# Patient Record
Sex: Male | Born: 1980 | Hispanic: Yes | Marital: Married | Smoking: Never smoker
Health system: Southern US, Community
[De-identification: ages and names within clinical notes are randomized; demographics above are authoritative.]

## PROBLEM LIST (undated history)

## (undated) DIAGNOSIS — K219 Gastro-esophageal reflux disease without esophagitis: Secondary | ICD-10-CM

---

## 2016-09-20 HISTORY — PX: WISDOM TOOTH EXTRACTION: SHX21

## 2018-06-29 ENCOUNTER — Encounter: Payer: Self-pay | Admitting: Gastroenterology

## 2018-07-19 ENCOUNTER — Encounter: Payer: Self-pay | Admitting: Gastroenterology

## 2018-07-19 ENCOUNTER — Ambulatory Visit: Payer: BLUE CROSS/BLUE SHIELD | Admitting: Gastroenterology

## 2018-07-19 ENCOUNTER — Telehealth: Payer: Self-pay

## 2018-07-19 ENCOUNTER — Other Ambulatory Visit: Payer: Self-pay

## 2018-07-19 VITALS — BP 122/80 | HR 60 | Resp 16 | Wt 183.8 lb

## 2018-07-19 DIAGNOSIS — K219 Gastro-esophageal reflux disease without esophagitis: Secondary | ICD-10-CM

## 2018-07-19 MED ORDER — OMEPRAZOLE 40 MG PO CPDR: 40.0000 mg | DELAYED_RELEASE_CAPSULE | Freq: Two times a day (BID) | ORAL | 0 refills | Status: DC

## 2018-07-19 NOTE — Patient Instructions (Addendum)
Opciones de alimentos para pacientes con reflujo gastroesofgico - Adultos (Food Choices for Gastroesophageal Reflux Disease, Adult) Cuando se tiene reflujo gastroesofgico (ERGE), los alimentos que se ingieren y los hbitos de alimentacin son muy importantes. Elegir los alimentos adecuados puede ayudar a [employer]. QU PAUTAS DEBO SEGUIR?  Elija las frutas, los vegetales, los cereales integrales y los productos lcteos con bajo contenido de [place].  Elija las carnes de [place], de pescado y de ave con bajo contenido de grasas.  Limite las grasas, [address] [place], los aderezos para [place], la [place], los frutos secos y Programme researcher, broadcasting/film/video.  Lleve un registro de alimentos. Esto ayuda a identificar los alimentos que ocasionan sntomas.  Evite los alimentos que le ocasionen sntomas. Pueden ser distintos para cada persona.  Haga comidas pequeas durante Glass blower/designer de 3 comidas abundantes.  Coma lentamente, en un lugar donde est distendido.  Limite el consumo de alimentos fritos.  Cocine los alimentos utilizando mtodos que no sean la fritura.  Evite el consumo alcohol.  Evite beber grandes cantidades de lquidos con las comidas.  Evite agacharse o recostarse hasta despus de 2 o 3horas de haber comido.  QU ALIMENTOS NO SE RECOMIENDAN? Estos son algunos alimentos y bebidas que pueden empeorar los sntomas: Veterinary surgeon. Jugo de tomate. Salsa de tomate y espagueti. Ajes. Cebolla y [place]. Rbano picante. Frutas Naranjas, pomelos y limn (fruta y [place]). Carnes Carnes de [place], de pescado y de ave con gran contenido de grasas. Esto incluye los perros calientes, las [place], el [place], la salchicha, el salame y el tocino. Lcteos Leche entera y [place]. [employer]. Crema. [place]. Helados. Queso crema. Bebidas T o caf. Bebidas gaseosas o bebidas energizantes. Condimentos Salsa picante. Salsa barbacoa. Dulces/postres Chocolate y cacao. Ros[place]illas.  Menta y mentol. Grasas y [employer]. Esto incluye las papas fritas. Otros Vinagre. Especias picantes. Esto incluye la pimienta negra, la pimienta blanca, la pimienta roja, la pimienta de cayena, el curry en polvo, los clavos de [place], el jengibre y el [place] en polvo. Esta no es [employer] de los alimentos y las bebidas que se Theatre stage manager. Comunquese con el nutricionista para recibir ms informacin. Esta informacin no tiene Theme park manager el consejo del mdico. Asegrese de hacerle al mdico cualquier pregunta que tenga. Document Released: 03/07/2012 Document Revised: 09/27/2014 Document Reviewed: 07/11/2013 Elsevier Interactive Patient Education  2017 Elsevier Inc. Enfermedad de reflujo gastroesofgico en los adultos Gastroesophageal Reflux Disease, Adult Normalmente, la comida baja por el esfago y se mantiene en el [place], donde se la digiere. Sin embargo, cuando una persona tiene enfermedad de reflujo gastroesofgico (ERGE), la comida y jugo gstrico (cido estomacal) suben por Training and development officer. Cuando esto ocurre, el esfago se inflama y duele. Con el tiempo, la ERGE puede producir pequeos agujeros (lceras) en el revestimiento del esfago. Cules son las causas? Esta afeccin se debe a un problema en el msculo que se encuentra entre el esfago y Investment banker, corporate (esfnter esofgico inferior, o EEI). Normalmente, el EEI se cierra una vez que la comida pasa a travs del esfago hasta el [place]. Cuando el EEI se encuentra debilitado o tiene alguna anomala, no se cierra por completo, y eso permite que tanto la comida como el jugo gstrico, que es cido, [place] a subir por el esfago. El EEI puede debilitarse a causa de ciertas sustancias alimenticias, medicamentos y afecciones, que incluyen:  Consumo de Agricola.  [place].  Tener una hernia de hiato.  Consumo excesivo de  alcohol.  Ciertos alimentos y bebidas, como caf, chocolate, cebollas y [place].  Qu  incrementa el riesgo? Es ms probable que esta afeccin se manifieste en:  Personas con aumento del Runner, broadcasting/film/video.  Personas con trastornos del tejido conjuntivo.  Personas que toman antiinflamatorios no esteroideos (AINE).  Cules son los signos o los sntomas? Los sntomas de esta afeccin incluyen lo siguiente:  Merchant navy officer.  Dificultad o dolor al tragar.  Sensacin de Warehouse manager un bulto en la garganta.  Sabor amargo en la boca.  Mal aliento.  Gran cantidad de saliva.  Estmago inflamado o con Dentist.  Eructos.  Dolor en el pecho.  Dificultad para respirar o sibilancias.  Tos constante (crnica) o tos nocturna.  Desgaste del Engineer, structural.  Prdida de peso.  El dolor de pecho puede deberse a distintas enfermedades. Es importante que consulte al mdico si tiene dolor de [place]. Cmo se diagnostica? El mdico le har una historia clnica y un examen fsico. Para determinar si tiene ERGE leve o grave, el mdico tambin puede controlar cmo usted reacciona al tratamiento. Tambin pueden hacerle otros estudios, por ejemplo:  Una endoscopa para examinarle el estmago y el esfago con Neomia Dear cmara pequea.  Una prueba para medir el grado de Technical sales engineer.  Una prueba para medir cunta presin hay en el esfago.  Un estudio de trnsito baritado regular o modificado para ver la forma, el tamao y el funcionamiento del esfago.  Cmo se trata? El [place] del tratamiento es ayudar a Paramedic los sntomas y Automotive engineer las complicaciones. El tratamiento de esta afeccin puede variar segn la gravedad de los sntomas. El mdico puede recomendarle lo siguiente:  Cambios en la dieta.  Medicamentos.  Someterse a [place].  Siga estas instrucciones en su casa: Dieta  Siga la dieta recomendada por el mdico. Esto puede incluir evitar ciertos alimentos y bebidas, por ejemplo: ? Caf y t (con o sin cafena). ? Bebidas que contengan alcohol. ? Bebidas  energticas y deportivas. ? Bebidas gaseosas y refrescos. ? Chocolate y cacao. ? Menta y esencia de [place]. ? Ajo y cebolla. ? Rbano picante. ? Alimentos condimentados, picantes y cidos, por ejemplo, todos los tipos de pimientas, [place] en polvo, curry en polvo, vinagre, salsas picantes y [employer]. ? Ctricos y sus jugos, por ejemplo, naranjas, limones y limas. ? Alimentos a base de [address], como salsa de [place], [place], salsa picante y pizza con salsa de [place]. ? Alimentos fritos y [place], [place] donas, papas fritas y aderezos ricos en grasas. ? Carnes con alto contenido de grasa, como salchichas, y cortes de carnes rojas y blancas con mucha grasa, por ejemplo, chuletas o costillas, embutidos, jamn y tocino. ? Productos lcteos ricos en grasas, como leche [place], [place] y [place] crema.  Haga varias comidas pequeas y frecuentes Freight forwarder de comidas abundantes.  Evite beber grandes cantidades de lquidos con las comidas.  Evite comer 2 o 3horas antes de acostarse.  Evite recostarse inmediatamente despus de comer.  No haga ejercicios enseguida despus de comer. Instrucciones generales  Est atento a cualquier cambio en los sntomas.  [employer] medicamentos de venta libre y los recetados solamente como se lo haya indicado el mdico. No tome aspirina, ibuprofeno ni otros antiinflamatorios no esteroideos (AINE) a menos que el mdico se lo indique.  No consuma ningn producto que contenga tabaco, lo que incluye cigarrillos, tabaco de Theatre manager y Administrator, Civil Service. Si necesita ayuda para dejar de fumar, consulte al mdico.  Use ropas sueltas. No use nada apretado alrededor de la cintura que haga presin sobre el abdomen.  Levante (eleve) la cabecera de la cama 6pulgadas (15cm).  Trate de reducir [employer] de estrs con actividades tales como el yoga o la meditacin. Si necesita ayuda para reducir [employer] de estrs, consulte al mdico.  Si tiene sobrepeso, baje  hasta llegar a un peso saludable para usted. Pdale consejos al mdico para bajar de peso de [place] segura.  Concurra a todas las visitas de control como se lo haya indicado el mdico. Esto es importante. Comunquese con un mdico si:  Aparecen nuevos sntomas.  Baja de peso sin causa aparente.  Tiene dificultad para tragar, o le duele cuando traga.  Tiene tos persistente o sibilancias.  Los sntomas no mejoran con Scientist, research (medical).  Tiene la voz ronca. Solicite ayuda de inmediato si:  [employer], el cuello, la [place], los dientes o la espalda.  Se siente transpirado, mareado o tiene una sensacin de desvanecimiento.  Siente falta de aire o Journalist, newspaper.  Vomita y el vmito tiene un aspecto similar a la sangre o a los granos de caf.  Se desmaya.  Las heces son sanguinolentas o negras.  No puede tragar, beber o comer. Esta informacin no tiene Theme park manager el consejo del mdico. Asegrese de hacerle al mdico cualquier pregunta que tenga. Document Released: 06/16/2005 Document Revised: 12/10/2016 Document Reviewed: 01/01/2015 Elsevier Interactive Patient Education  2018 [employer].  Enfermedad de reflujo gastroesofgico en los adultos Gastroesophageal Reflux Disease, Adult Normalmente, la comida baja por el esfago y se mantiene en el [place], donde se la digiere. Sin embargo, cuando una persona tiene enfermedad de reflujo gastroesofgico (ERGE), la comida y jugo gstrico (cido estomacal) suben por Training and development officer. Cuando esto ocurre, el esfago se inflama y duele. Con el tiempo, la ERGE puede producir pequeos agujeros (lceras) en el revestimiento del esfago. Cules son las causas? Esta afeccin se debe a un problema en el msculo que se encuentra entre el esfago y Investment banker, corporate (esfnter esofgico inferior, o EEI). Normalmente, el EEI se cierra una vez que la comida pasa a travs del esfago hasta el [place]. Cuando el EEI se encuentra  debilitado o tiene alguna anomala, no se cierra por completo, y eso permite que tanto la comida como el jugo gstrico, que es cido, [place] a subir por el esfago. El EEI puede debilitarse a causa de ciertas sustancias alimenticias, medicamentos y afecciones, que incluyen:  Consumo de [place].  [place].  Tener una hernia de hiato.  Consumo excesivo de alcohol.  Ciertos alimentos y bebidas, como caf, chocolate, cebollas y [place].  Qu incrementa el riesgo? Es ms probable que esta afeccin se manifieste en:  Personas con aumento del Runner, broadcasting/film/video.  Personas con trastornos del tejido conjuntivo.  Personas que toman antiinflamatorios no esteroideos (AINE).  Cules son los signos o los sntomas? Los sntomas de esta afeccin incluyen lo siguiente:  Merchant navy officer.  Dificultad o dolor al tragar.  Sensacin de Warehouse manager un bulto en la garganta.  Sabor amargo en la boca.  Mal aliento.  Gran cantidad de saliva.  Estmago inflamado o con Dentist.  Eructos.  Dolor en el pecho.  Dificultad para respirar o sibilancias.  Tos constante (crnica) o tos nocturna.  Desgaste del Engineer, structural.  Prdida de peso.  El dolor de pecho puede deberse a distintas enfermedades. Es importante que consulte al mdico si tiene dolor de [place]. Cmo se diagnostica?  El mdico le har una historia clnica y un examen fsico. Para determinar si tiene ERGE leve o grave, el mdico tambin puede controlar cmo usted reacciona al tratamiento. Tambin pueden hacerle otros estudios, por ejemplo:  Una endoscopa para examinarle el estmago y el esfago con Neomia Dear cmara pequea.  Una prueba para medir el grado de Technical sales engineer.  Una prueba para medir cunta presin hay en el esfago.  Un estudio de trnsito baritado regular o modificado para ver la forma, el tamao y el funcionamiento del esfago.  Cmo se trata? [place] del tratamiento es ayudar a Paramedic los sntomas y Automotive engineer las  complicaciones. El tratamiento de esta afeccin puede variar segn la gravedad de los sntomas. El mdico puede recomendarle lo siguiente:  Cambios en la dieta.  Medicamentos.  Someterse a [place].  Siga estas instrucciones en su casa: Dieta  Siga la dieta recomendada por el mdico. Esto puede incluir evitar ciertos alimentos y bebidas, por ejemplo: ? Caf y t (con o sin cafena). ? Bebidas que contengan alcohol. ? Bebidas energticas y deportivas. ? Bebidas gaseosas y refrescos. ? Chocolate y cacao. ? Menta y esencia de [place]. ? Ajo y cebolla. ? Rbano picante. ? Alimentos condimentados, picantes y cidos, por ejemplo, todos los tipos de pimientas, [place] en polvo, curry en polvo, vinagre, salsas picantes y [employer]roleum. ? Ctricos y sus jugos, por ejemplo, naranjas, limones y limas. ? Alimentos a base de [address], como salsa de [place], [place], salsa picante y pizza con salsa de [place]. ? Alimentos fritos y [place], [place] donas, papas fritas y aderezos ricos en grasas. ? Carnes con alto contenido de grasa, como salchichas, y cortes de carnes rojas y blancas con mucha grasa, por ejemplo, chuletas o costillas, embutidos, jamn y tocino. ? Productos lcteos ricos en grasas, como leche [place], [place] y [place] crema.  Haga varias comidas pequeas y frecuentes Freight forwarder de comidas abundantes.  Evite beber grandes cantidades de lquidos con las comidas.  Evite comer 2 o 3horas antes de acostarse.  Evite recostarse inmediatamente despus de comer.  No haga ejercicios enseguida despus de comer. Instrucciones generales  Est atento a cualquier cambio en los sntomas.  [employer] medicamentos de venta libre y los recetados solamente como se lo haya indicado el mdico. No tome aspirina, ibuprofeno ni otros antiinflamatorios no esteroideos (AINE) a menos que el mdico se lo indique.  No consuma ningn producto que contenga tabaco, lo que incluye cigarrillos, tabaco de  Theatre manager y Administrator, Civil Service. Si necesita ayuda para dejar de fumar, consulte al mdico.  Use ropas sueltas. No use nada apretado alrededor de la cintura que haga presin sobre el abdomen.  Levante (eleve) la cabecera de la cama 6pulgadas (15cm).  Trate de reducir [employer] de estrs con actividades tales como el yoga o la meditacin. Si necesita ayuda para reducir [employer] de estrs, consulte al mdico.  Si tiene sobrepeso, baje hasta llegar a un peso saludable para usted. Pdale consejos al mdico para bajar de peso de [place] segura.  Concurra a todas las visitas de control como se lo haya indicado el mdico. Esto es importante. Comunquese con un mdico si:  Aparecen nuevos sntomas.  Baja de peso sin causa aparente.  Tiene dificultad para tragar, o le duele cuando traga.  Tiene tos persistente o sibilancias.  Los sntomas no mejoran con Scientist, research (medical).  Tiene la voz ronca. Solicite ayuda de inmediato si:  [employer], [place]  cuello, la mandbula, los dientes o la espalda.  Se siente transpirado, mareado o tiene una sensacin de desvanecimiento.  Siente falta de aire o Journalist, newspaper.  Vomita y el vmito tiene un aspecto similar a la sangre o a los granos de caf.  Se desmaya.  Las heces son sanguinolentas o negras.  No puede tragar, beber o comer. Esta informacin no tiene Theme park manager el consejo del mdico. Asegrese de hacerle al mdico cualquier pregunta que tenga. Document Released: 06/16/2005 Document Revised: 12/10/2016 Document Reviewed: 01/01/2015 Elsevier Interactive Patient Education  [employer].

## 2018-07-19 NOTE — Progress Notes (Signed)
   Arlyss Repress, MD [address]  [address]  [place], [place] [address]  Main: [phone]  Fax: [phone]    Gastroenterology Consultation  Referring Provider:     Guadlupe Spanish, [place]* Primary Care Physician:  Guadlupe Spanish, PA-C Primary Gastroenterologist:  Dr. Arlyss Repress Reason for Consultation:  Heartburn        HPI:   Taylor Mullen is a 37 y.o. Spanish-speaking male referred by Dr. Nelson Chimes, [place] Gray, PA-C  for consultation & management of heartburn.  Patient reports that he has been experiencing several years history of severe burning sensation in the chest associated with regurgitation.  He denies difficulty swallowing, epigastric pain, nausea, weight loss.  He was started on omeprazole 20 mg once daily which did not provide any relief.  He tried twice daily temporarily.  He denies smoking or drinking alcohol.  He denies any recent weight gain. He had laboratory tests performed by his PCP about a month ago and reportedly normal  He denies family history of GI malignancy  NSAIDs: None  Antiplts/Anticoagulants/Anti thrombotics: None  GI Procedures: None He denies any GI surgeries  No past medical history on file.   Prior to Admission medications   Not on File    No family history on file.   Social History   Tobacco Use  . Smoking status: Never Smoker  . Smokeless tobacco: Never Used  Substance Use Topics  . Alcohol use: Never    Frequency: Never  . Drug use: Never    Allergies as of 07/19/2018  . (No Known Allergies)    Review of Systems:    All systems reviewed and negative except where noted in HPI.   Physical Exam:  BP 122/80 (BP Location: Left Arm, Patient Position: Sitting, Cuff Size: Large)   Pulse 60   Resp 16   Wt 183 lb 12.8 oz (83.4 kg)  No LMP for male patient.  General:   Alert,  Well-developed, well-nourished, pleasant and cooperative in NAD Head:  Normocephalic and atraumatic. Eyes:  Sclera clear,  no icterus.   Conjunctiva pink. Ears:  Normal auditory acuity. Nose:  No deformity, discharge, or lesions. Mouth:  No deformity or lesions,oropharynx pink & moist. Neck:  Supple; no masses or thyromegaly. Lungs:  Respirations even and unlabored.  Clear throughout to auscultation.   No wheezes, crackles, or rhonchi. No acute distress. Heart:  Regular rate and rhythm; no murmurs, clicks, rubs, or gallops. Abdomen:  Normal bowel sounds. Soft, non-tender and non-distended without masses, hepatosplenomegaly or hernias noted.  No guarding or rebound tenderness.   Rectal: Not performed Msk:  Symmetrical without gross deformities. Good, equal movement & strength bilaterally. Pulses:  Normal pulses noted. Extremities:  No clubbing or edema.  No cyanosis. Neurologic:  Alert and oriented x3;  grossly normal neurologically. Skin:  Intact without significant lesions or rashes. No jaundice. Lymph Nodes:  No significant cervical adenopathy. Psych:  Alert and cooperative. Normal mood and affect.  Imaging Studies: None  Assessment and Plan:   Taylor Mullen is a 37 y.o. Spanish-speaking male with chronic heartburn with regurgitation.  GERD: Start Prilosec 40 mg twice daily Antireflux lifestyle, information provided in Spanish Schedule EGD   Follow up in 2 months   Arlyss Repress, MD

## 2018-07-20 NOTE — Telephone Encounter (Signed)
Medication has been sent to [facility], pt has been notified

## 2018-07-31 ENCOUNTER — Encounter: Payer: Self-pay | Admitting: Emergency Medicine

## 2018-08-01 ENCOUNTER — Ambulatory Visit
Admission: RE | Admit: 2018-08-01 | Discharge: 2018-08-01 | Disposition: A | Discharge status: Home / Self Care | Payer: BLUE CROSS/BLUE SHIELD | Source: Ambulatory Visit | Attending: Gastroenterology | Admitting: Gastroenterology

## 2018-08-01 ENCOUNTER — Other Ambulatory Visit: Payer: Self-pay

## 2018-08-01 ENCOUNTER — Ambulatory Visit: Payer: BLUE CROSS/BLUE SHIELD | Admitting: Anesthesiology

## 2018-08-01 ENCOUNTER — Encounter
Admission: RE | Disposition: A | Discharge status: Home / Self Care | Payer: Self-pay | Source: Ambulatory Visit | Attending: Gastroenterology

## 2018-08-01 DIAGNOSIS — Z6832 Body mass index (BMI) 32.0-32.9, adult: Secondary | ICD-10-CM | POA: Insufficient documentation

## 2018-08-01 DIAGNOSIS — Z79899 Other long term (current) drug therapy: Secondary | ICD-10-CM | POA: Diagnosis not present

## 2018-08-01 DIAGNOSIS — K2950 Unspecified chronic gastritis without bleeding: Secondary | ICD-10-CM | POA: Insufficient documentation

## 2018-08-01 DIAGNOSIS — K219 Gastro-esophageal reflux disease without esophagitis: Secondary | ICD-10-CM | POA: Diagnosis not present

## 2018-08-01 DIAGNOSIS — R12 Heartburn: Secondary | ICD-10-CM | POA: Diagnosis not present

## 2018-08-01 DIAGNOSIS — K228 Other specified diseases of esophagus: Secondary | ICD-10-CM | POA: Diagnosis not present

## 2018-08-01 DIAGNOSIS — E669 Obesity, unspecified: Secondary | ICD-10-CM | POA: Diagnosis not present

## 2018-08-01 DIAGNOSIS — K3189 Other diseases of stomach and duodenum: Secondary | ICD-10-CM | POA: Insufficient documentation

## 2018-08-01 HISTORY — PX: ESOPHAGOGASTRODUODENOSCOPY (EGD) WITH PROPOFOL: SHX5813

## 2018-08-01 HISTORY — DX: Gastro-esophageal reflux disease without esophagitis: K21.9

## 2018-08-01 SURGERY — ESOPHAGOGASTRODUODENOSCOPY (EGD) WITH PROPOFOL
Anesthesia: General

## 2018-08-01 MED ORDER — PROPOFOL 10 MG/ML IV BOLUS
INTRAVENOUS | Status: AC
  Filled 2018-08-01: qty 40

## 2018-08-01 MED ORDER — LIDOCAINE HCL (CARDIAC) PF 100 MG/5ML IV SOSY
PREFILLED_SYRINGE | INTRAVENOUS | Status: DC | PRN
  Administered 2018-08-01: 80 mg via INTRAVENOUS

## 2018-08-01 MED ORDER — PROPOFOL 10 MG/ML IV BOLUS
INTRAVENOUS | Status: DC | PRN
  Administered 2018-08-01: 100 mg via INTRAVENOUS

## 2018-08-01 MED ORDER — FENTANYL CITRATE (PF) 100 MCG/2ML IJ SOLN
INTRAMUSCULAR | Status: DC | PRN
  Administered 2018-08-01: 25 ug via INTRAVENOUS
  Administered 2018-08-01: 50 ug via INTRAVENOUS
  Administered 2018-08-01: 25 ug via INTRAVENOUS

## 2018-08-01 MED ORDER — FENTANYL CITRATE (PF) 100 MCG/2ML IJ SOLN
INTRAMUSCULAR | Status: AC
  Filled 2018-08-01: qty 2

## 2018-08-01 MED ORDER — SODIUM CHLORIDE 0.9 % IV SOLN
INTRAVENOUS | Status: DC
  Administered 2018-08-01: 1000 mL via INTRAVENOUS

## 2018-08-01 NOTE — OR Nursing (Signed)
Maryjane HurterOtto here to interpret spanish for patient and wife.

## 2018-08-01 NOTE — Op Note (Signed)
[facility] Gastroenterology Patient Name: Taylor Mullen Procedure Date: 08/01/2018 1:52 PM MRN: [id number] Account #: [id number] Date of Birth: 1981/07/19 Admit Type: Outpatient Age: 37 Room: [facility] ENDO ROOM 4 Gender: Male Note Status: Finalized Procedure:            Upper GI endoscopy Indications:          Heartburn Providers:            Lin Landsman MD, MD Referring MD:         [facility] Medicines:            Monitored Anesthesia Care Complications:        No immediate complications. Estimated blood loss: None. Procedure:            Pre-Anesthesia Assessment:                       - Prior to the procedure, a History and Physical was                        performed, and patient medications and allergies were                        reviewed. The patient is competent. The risks and                        benefits of the procedure and the sedation options and                        risks were discussed with the patient. All questions                        were answered and informed consent was obtained.                        Patient identification and proposed procedure were                        verified by the physician, the nurse, the                        anesthesiologist, the anesthetist and the technician in                        the pre-procedure area in the procedure room in the                        endoscopy suite. Mental Status Examination: alert and                        oriented. Airway Examination: normal oropharyngeal                        airway and neck mobility. Respiratory Examination:                        clear to auscultation. CV Examination: normal.                        Prophylactic Antibiotics: The patient does not require  prophylactic antibiotics. Prior Anticoagulants: The                        patient has taken no previous anticoagulant or                        antiplatelet agents. ASA  Grade Assessment: II - A                        patient with mild systemic disease. After reviewing the                        risks and benefits, the patient was deemed in                        satisfactory condition to undergo the procedure. The                        anesthesia plan was to use monitored anesthesia care                        (MAC). Immediately prior to administration of                        medications, the patient was re-assessed for adequacy                        to receive sedatives. The heart rate, respiratory rate,                        oxygen saturations, blood pressure, adequacy of                        pulmonary ventilation, and response to care were                        monitored throughout the procedure. The physical status                        of the patient was re-assessed after the procedure.                       After obtaining informed consent, the endoscope was                        passed under direct vision. Throughout the procedure,                        the patient's blood pressure, pulse, and oxygen                        saturations were monitored continuously. The Endoscope                        was introduced through the mouth, and advanced to the                        second part of duodenum. The upper GI endoscopy was  accomplished without difficulty. The patient tolerated                        the procedure well. Findings:      The duodenal bulb and second portion of the duodenum were normal.      A single umbilicated lesion measuring 6 mm in diameter was found on the       greater curvature of the gastric antrum.      Diffuse mildly erythematous mucosa without bleeding was found in the       prepyloric region of the stomach.      The exam of the stomach was otherwise normal.      The cardia and gastric fundus were normal on retroflexion.      The gastric body was normal. Biopsies were taken with a cold  forceps for       Helicobacter pylori testing.      The Z-line was irregular and was found 35 cm from the incisors.      The examined esophagus was normal. Biopsies were taken with a cold       forceps for histology. Impression:           - Normal duodenal bulb and second portion of the                        duodenum.                       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                       - Erythematous mucosa in the prepyloric region of the                        stomach.                       - Normal gastric body. Biopsied.                       - Z-line irregular, 35 cm from the incisors.                       - Normal esophagus. Biopsied. Recommendation:       - Await pathology results.                       - Discharge patient to home (with spouse).                       - Resume previous diet today.                       - Continue present medications.                       - Await pathology results.                       - Return to my office as previously scheduled.                       -  Follow an antireflux regimen.                       - Use Prilosec (omeprazole) 40 mg PO daily. Procedure Code(s):    --- Professional ---                       [id number], Esophagogastroduodenoscopy, flexible, transoral;                        with biopsy, single or multiple Diagnosis Code(s):    --- Professional ---                       K31.89, Other diseases of stomach and duodenum                       K22.8, Other specified diseases of esophagus                       R12, Heartburn CPT copyright 2018 American [employer] Association. All rights reserved. The codes documented in this report are preliminary and upon coder review may  be revised to meet current compliance requirements. Dr. Ulyess Mort Lin Landsman MD, MD 08/01/2018 2:14:55 PM This report has been signed electronically. Number of Addenda: 0 Note  Initiated On: 08/01/2018 1:52 PM      [facility]

## 2018-08-01 NOTE — Anesthesia Preprocedure Evaluation (Signed)
Anesthesia Evaluation  Patient identified by MRN, date of birth, ID band Patient awake    Reviewed: Allergy & Precautions, NPO status , Patient's Chart, lab work & pertinent test results  History of Anesthesia Complications Negative for: history of anesthetic complications  Airway Mallampati: II  TM Distance: >3 FB Neck ROM: Full    Dental no notable dental hx.    Pulmonary neg pulmonary ROS, neg sleep apnea, neg COPD,    breath sounds clear to auscultation- rhonchi (-) wheezing      Cardiovascular Exercise Tolerance: Good (-) hypertension(-) CAD, (-) Past MI, (-) Cardiac Stents and (-) CABG  Rhythm:Regular Rate:Normal - Systolic murmurs and - Diastolic murmurs    Neuro/Psych negative neurological ROS  negative psych ROS   GI/Hepatic Neg liver ROS, GERD  ,  Endo/Other  negative endocrine ROSneg diabetes  Renal/GU negative Renal ROS     Musculoskeletal negative musculoskeletal ROS (+)   Abdominal (+) + obese,   Peds  Hematology negative hematology ROS (+)   Anesthesia Other Findings Past Medical History: No date: GERD (gastroesophageal reflux disease)   Reproductive/Obstetrics                             Anesthesia Physical Anesthesia Plan  ASA: II  Anesthesia Plan: General   Post-op Pain Management:    Induction: Intravenous  PONV Risk Score and Plan: 1 and Propofol infusion  Airway Management Planned: Natural Airway  Additional Equipment:   Intra-op Plan:   Post-operative Plan:   Informed Consent: I have reviewed the patients History and Physical, chart, labs and discussed the procedure including the risks, benefits and alternatives for the proposed anesthesia with the patient or authorized representative who has indicated his/her understanding and acceptance.   Dental advisory given  Plan Discussed with: CRNA and Anesthesiologist  Anesthesia Plan Comments:          Anesthesia Quick Evaluation

## 2018-08-01 NOTE — Anesthesia Post-op Follow-up Note (Signed)
Anesthesia QCDR form completed.        

## 2018-08-01 NOTE — H&P (Signed)
Arlyss Repress, MD [address]  [address]  [place], [place] [address]  Main: [phone]  Fax: [phone] Pager: [phone]  Primary Care Physician:  Guadlupe Spanish, PA-C Primary Gastroenterologist:  Dr. Arlyss Repress  Pre-Procedure History & Physical: HPI:  Taylor Mullen is a 37 y.o. male is here for an endoscopy.   Past Medical History:  Diagnosis Date  . GERD (gastroesophageal reflux disease)     Past Surgical History:  Procedure Laterality Date  . WISDOM TOOTH EXTRACTION  2018    Prior to Admission medications   Medication Sig Start Date End Date Taking? Authorizing Provider  omeprazole (PRILOSEC) 40 MG capsule Take 1 capsule (40 mg total) by mouth 2 (two) times daily before a meal. 07/19/18 10/17/18 Yes Maymunah Stegemann, Loel Dubonnet, MD    Allergies as of 07/20/2018  . (No Known Allergies)    History reviewed. No pertinent family history.  Social History   Socioeconomic History  . Marital status: Married    Spouse name: Not on file  . Number of children: Not on file  . Years of education: Not on file  . Highest education level: Not on file  Occupational History  . Not on file  Social Needs  . Financial resource strain: Not on file  . Food insecurity:    Worry: Not on file    Inability: Not on file  . Transportation needs:    Medical: Not on file    Non-medical: Not on file  Tobacco Use  . Smoking status: Never Smoker  . Smokeless tobacco: Never Used  Substance and Sexual Activity  . Alcohol use: Never    Frequency: Never  . Drug use: Never  . Sexual activity: Yes    Partners: Female  Lifestyle  . Physical activity:    Days per week: Not on file    Minutes per session: Not on file  . Stress: Not on file  Relationships  . Social connections:    Talks on phone: Not on file    Gets together: Not on file    Attends religious service: Not on file    Active member of club or organization: Not on file    Attends meetings of clubs or  organizations: Not on file    Relationship status: Not on file  . Intimate partner violence:    Fear of current or ex partner: Not on file    Emotionally abused: Not on file    Physically abused: Not on file    Forced sexual activity: Not on file  Other Topics Concern  . Not on file  Social History Narrative  . Not on file    Review of Systems: See HPI, otherwise negative ROS  Physical Exam: BP 119/77   Pulse (!) 55   Temp (!) 97.3 F (36.3 C) (Tympanic)   Resp 16   Ht 5\' 2"  (1.575 m)   Wt 81.6 kg   SpO2 97%   BMI 32.92 kg/m  General:   Alert,  pleasant and cooperative in NAD Head:  Normocephalic and atraumatic. Neck:  Supple; no masses or thyromegaly. Lungs:  Clear throughout to auscultation.    Heart:  Regular rate and rhythm. Abdomen:  Soft, nontender and nondistended. Normal bowel sounds, without guarding, and without rebound.   Neurologic:  Alert and  oriented x4;  grossly normal neurologically.  Impression/Plan: Taylor Mullen is here for an endoscopy to be performed for GERD  Risks, benefits, limitations, and alternatives regarding  endoscopy have been reviewed with the patient.  Questions have been answered.  All parties agreeable.   Lannette Donath, MD  08/01/2018, 1:48 PM

## 2018-08-01 NOTE — Anesthesia Postprocedure Evaluation (Signed)
Anesthesia Post Note  Patient: Taylor Mullen  Procedure(s) Performed: ESOPHAGOGASTRODUODENOSCOPY (EGD) WITH PROPOFOL (N/A )  Patient location during evaluation: Endoscopy Anesthesia Type: General Level of consciousness: awake and alert Pain management: pain level controlled Vital Signs Assessment: post-procedure vital signs reviewed and stable Respiratory status: spontaneous breathing, nonlabored ventilation, respiratory function stable and patient connected to nasal cannula oxygen Cardiovascular status: blood pressure returned to baseline and stable Postop Assessment: no apparent nausea or vomiting Anesthetic complications: no     Last Vitals:  Vitals:   08/01/18 1450 08/01/18 1500  BP: 111/78 111/78  Pulse: (!) 52 (!) 50  Resp: 15 14  Temp:    SpO2: 100% 98%    Last Pain:  Vitals:   08/01/18 1304  TempSrc: Tympanic  PainSc: 0-No pain                 Niomie Englert S

## 2018-08-01 NOTE — Transfer of Care (Signed)
Immediate Anesthesia Transfer of Care Note  Patient: Taylor Mullen  Procedure(s) Performed: ESOPHAGOGASTRODUODENOSCOPY (EGD) WITH PROPOFOL (N/A )  Patient Location: PACU  Anesthesia Type:General  Level of Consciousness: awake, alert  and oriented  Airway & Oxygen Therapy: Patient Spontanous Breathing and Patient connected to nasal cannula oxygen  Post-op Assessment: Report given to RN and Post -op Vital signs reviewed and stable  Post vital signs: Reviewed and stable  Last Vitals:  Vitals Value Taken Time  BP    Temp    Pulse    Resp    SpO2      Last Pain:  Vitals:   08/01/18 1304  TempSrc: Tympanic  PainSc: 0-No pain         Complications: No apparent anesthesia complications

## 2018-08-02 ENCOUNTER — Encounter: Payer: Self-pay | Admitting: Gastroenterology

## 2018-08-04 LAB — SURGICAL PATHOLOGY

## 2018-08-29 ENCOUNTER — Ambulatory Visit: Payer: BLUE CROSS/BLUE SHIELD | Admitting: Gastroenterology

## 2018-10-03 ENCOUNTER — Encounter: Payer: Self-pay | Admitting: Gastroenterology

## 2018-10-03 ENCOUNTER — Ambulatory Visit: Payer: BLUE CROSS/BLUE SHIELD | Admitting: Gastroenterology

## 2018-10-03 VITALS — BP 117/73 | HR 64 | Resp 17 | Wt 182.6 lb

## 2018-10-03 DIAGNOSIS — K219 Gastro-esophageal reflux disease without esophagitis: Secondary | ICD-10-CM | POA: Diagnosis not present

## 2018-10-03 DIAGNOSIS — E6609 Other obesity due to excess calories: Secondary | ICD-10-CM

## 2018-10-03 DIAGNOSIS — Z6833 Body mass index (BMI) 33.0-33.9, adult: Secondary | ICD-10-CM

## 2018-10-03 DIAGNOSIS — E669 Obesity, unspecified: Secondary | ICD-10-CM | POA: Insufficient documentation

## 2018-10-03 NOTE — Progress Notes (Signed)
Arlyss Repress, MD [address]  [address]  [place], [place] [address]  Main: [phone]  Fax: [phone]    Gastroenterology Consultation  Referring Provider:     Marya Fossa, PA-C Primary Care Physician:  Marya Fossa, PA-C Primary Gastroenterologist:  Dr. Arlyss Repress Reason for Consultation:  Heartburn        HPI:   Taylor Mullen is a 38 y.o. Spanish-speaking male referred by Dr. Marya Fossa, PA-C  for consultation & management of heartburn.  Patient reports that he has been experiencing several years history of severe burning sensation in the chest associated with regurgitation.  He denies difficulty swallowing, epigastric pain, nausea, weight loss.  He was started on omeprazole 20 mg once daily which did not provide any relief.  He tried twice daily temporarily.  He denies smoking or drinking alcohol.  He denies any recent weight gain. He had laboratory tests performed by his PCP about a month ago and reportedly normal  Follow-up visit 10/03/2018 Patient underwent EGD which was unremarkable including normal gastric biopsies and no evidence of eosinophilic esophagitis.  Patient is taking omeprazole 40 mg twice daily before meals.  He continues to have regurgitation although not heartburn.  His weight has been stable.  He denies family history of GI malignancy  NSAIDs: None  Antiplts/Anticoagulants/Anti thrombotics: None  GI Procedures:  EGD 08/01/2018 - Normal duodenal bulb and second portion of the duodenum. 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 - Erythematous mucosa in the prepyloric region of the stomach. - Normal gastric body. Biopsied. - Z-line irregular, 35 cm from the incisors. - Normal esophagus. Biopsied.  DIAGNOSIS:  A. 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  - NEGATIVE FOR H. PYLORI, DYSPLASIA, AND MALIGNANCY.   B.  ESOPHAGUS, DISTAL; COLD BIOPSY:  - UNREMARKABLE SQUAMOUS MUCOSA.  - NEGATIVE FOR EOSINOPHILS, DYSPLASIA, AND MALIGNANCY.   He denies any GI surgeries  Past Medical History:  Diagnosis Date  . GERD (gastroesophageal reflux disease)     Current Outpatient Medications:  .  omeprazole (PRILOSEC) 40 MG capsule, Take 1 capsule (40 mg total) by mouth 2 (two) times daily before a meal., Disp: 180 capsule, Rfl: 0  No family history on file.   Social History   Tobacco Use  . Smoking status: Never Smoker  . Smokeless tobacco: Never Used  Substance Use Topics  . Alcohol use: Never    Frequency: Never  . Drug use: Never    Allergies as of 10/03/2018  . (No Known Allergies)    Review of Systems:    All systems reviewed and negative except where noted in HPI.   Physical Exam:  BP 117/73 (BP Location: Left Arm, Patient Position: Sitting, Cuff Size: Large)   Pulse 64   Resp 17   Wt 182 lb 9.6 oz (82.8 kg)   BMI 33.40 kg/m  No LMP for male patient.  General:   Alert,  Well-developed, well-nourished, pleasant and cooperative in NAD Head:  Normocephalic and atraumatic. Eyes:  Sclera clear, no icterus.   Conjunctiva pink. Ears:  Normal auditory acuity. Nose:  No deformity, discharge, or lesions. Mouth:  No deformity or lesions,oropharynx pink & moist. Neck:  Supple; no masses or thyromegaly. Lungs:  Respirations even and unlabored.  Clear throughout to auscultation.   No wheezes, crackles, or rhonchi. No acute distress. Heart:  Regular rate and rhythm; no  murmurs, clicks, rubs, or gallops. Abdomen:  Normal bowel sounds. Soft, non-tender and non-distended without masses, hepatosplenomegaly or hernias noted.  No guarding or rebound tenderness.   Rectal: Not performed Msk:  Symmetrical without gross deformities. Good, equal movement & strength bilaterally. Pulses:  Normal pulses noted. Extremities:  No clubbing or edema.  No cyanosis. Neurologic:  Alert and oriented x3;  grossly  normal neurologically. Skin:  Intact without significant lesions or rashes. No jaundice. Lymph Nodes:  No significant cervical adenopathy. Psych:  Alert and cooperative. Normal mood and affect.  Imaging Studies: None  Assessment and Plan:   Taylor Mullen is a 38 y.o. Spanish-speaking male with chronic heartburn and regurgitation, symptoms partially improved on omeprazole 40 mg twice daily.  He continues to have regurgitation  GERD: EGD with no evidence of erosive esophagitis or eosinophilic esophagitis Switch from Prilosec to Dexilant 60 mg daily, samples provided Antireflux lifestyle, information provided in Spanish Highly encouraged him to lose 10 to 15 pounds If his symptoms are persistent, will evaluate for gallbladder etiology   Follow up in 3 months   Arlyss Repress, MD

## 2018-10-03 NOTE — Patient Instructions (Signed)
Enfermedad de reflujo gastroesofgico en los adultos Gastroesophageal Reflux Disease, Adult El reflujo gastroesofgico (RGE) ocurre cuando el cido del estmago sube por el tubo que conecta la boca con el estmago (esfago). Normalmente, la comida baja por el esfago y se mantiene en el estmago, donde se la digiere. Sin embargo, cuando una persona tiene ERGE, los alimentos y el cido estomacal suelen volver al esfago. Si esto se vuelve un problema ms grave, a la persona se le puede diagnosticar una enfermedad llamada enfermedad de reflujo gastroesofgico (ERGE). La ERGE ocurre cuando el reflujo:  Sucede a menudo.  Causa sntomas frecuentes o graves.  Causa problemas tales como dao en el esfago. Cuando el cido del estmago entra en contacto con el esfago, el cido puede provocar dolor (inflamacin) en el esfago. Con el tiempo, pueden formarse pequeos agujeros (lceras) en el revestimiento del esfago. Cules son las causas? Esta afeccin se debe a un problema en el msculo que se encuentra entre el esfago y el estmago (esfnter esofgico inferior, o EEI). Normalmente, el EEI se cierra una vez que la comida pasa a travs del esfago hasta el estmago. Cuando el EEI se encuentra debilitado o tiene alguna anomala, no se cierra por completo, y eso permite que tanto la comida como el jugo gstrico, que es cido, vuelvan a subir por el esfago. El EEI puede debilitarse a causa de ciertas sustancias alimenticias, medicamentos y afecciones, que incluyen:  El consumo de tabaco.  Embarazo.  Tener una hernia de hiato.  Consumo de alcohol.  Ciertos alimentos y bebidas, como caf, chocolate, cebollas y menta. Qu incrementa el riesgo? Es ms probable que tenga esta afeccin si:  Tiene un aumento del peso corporal.  Tiene un trastorno del tejido conjuntivo.  Usa antiinflamatorios no esteroideos (AINE). Cules son los signos o los sntomas? Los sntomas de esta afeccin  incluyen:  Acidez estomacal.  Dificultad o dolor al tragar.  Sensacin de tener un bulto en la garganta.  Sabor amargo en la boca.  Mal aliento.  Gran cantidad de saliva.  Estmago inflamado o con malestar.  Eructos.  Dolor en el pecho. El dolor de pecho puede deberse a distintas afecciones. Es importante que consulte al mdico si tiene dolor de pecho.  Dificultad para respirar o sibilancias.  Tos constante (crnica) o tos nocturna.  Desgaste del esmalte dental.  Prdida de peso. Cmo se diagnostica? El mdico le har una historia clnica y un examen fsico. Para determinar si tiene ERGE leve o grave, el mdico tambin puede controlar cmo usted reacciona al tratamiento. Tambin pueden hacerle estudios, que incluyen los siguientes:  Un estudio para examinarle el estmago y el esfago con una cmara pequea (endoscopa).  Una prueba para medir el grado de acidez en el esfago.  Una prueba para medir cunta presin hay en el esfago.  Un estudio de deglucin con bario comn o modificado para ver la forma, el tamao y el funcionamiento del esfago. Cmo se trata? El objetivo del tratamiento es ayudar a aliviar los sntomas y evitar las complicaciones. El tratamiento de esta afeccin puede variar segn la gravedad de los sntomas. El mdico puede recomendarle lo siguiente:  Cambios en la dieta.  Medicamentos.  Una ciruga. Siga estas indicaciones en su casa: Comida y bebida   Siga la dieta recomendada por el mdico. Esto puede incluir evitar ciertos alimentos y bebidas, por ejemplo: ? Caf y t (con o sin cafena). ? Bebidas que contengan alcohol. ? Bebidas energticas y deportivas. ? Bebidas gaseosas o   refrescos. ? Chocolate y cacao. ? Menta y [place]. ? Ajo y cebolla. ? Rbano picante. ? Alimentos condimentados, picantes y cidos, por ejemplo, todos los tipos de pimientas, [place] en polvo, curry en polvo, vinagre, salsas picantes y [employer]nic[employer]. ? Ctricos y sus jugos, por ejemplo, naranjas, limones y limas. ? Alimentos a base de tomate, como salsa de [place], [place], salsa picante y pizza con salsa de [place]. ? Alimentos fritos y [place], [place] donas, papas fritas y aderezos ricos en grasas. ? Carnes con alto contenido de grasa, como salchichas, y cortes de carnes rojas y blancas con mucha grasa, por ejemplo, chuletas o costillas, embutidos, jamn y tocino. ? Productos lcteos ricos en grasas, como leche [place], [place] y [place] crema.  Haga comidas pequeas y frecuentes Medical sales representative de comidas abundantes.  Evite beber grandes cantidades de lquidos con las comidas.  Evite comer 2 o 3horas antes de acostarse.  Evite recostarse inmediatamente despus de comer.  No haga ejercicios enseguida despus de comer. Estilo de vida   No consuma ningn producto que contenga nicotina o tabaco, como cigarrillos, cigarrillos electrnicos y tabaco de Higher education careers adviser. Si necesita ayuda para dejar de fumar, consulte al [employer].  Trate de reducir el estrs con mtodos como el yoga o la meditacin. Si necesita ayuda para reducir [employer] de estrs, consulte al mdico.  Si tiene sobrepeso, baje hasta llegar a un peso saludable para usted. Pdale consejos al mdico para bajar de peso de [place] segura. Indicaciones generales  Est atento a cualquier cambio en los sntomas.  Tome los medicamentos de venta libre y los recetados solamente como se lo haya indicado el mdico. No tome aspirina, ibuprofeno ni otros AINE a menos que el mdico se lo indique.  Use ropa holgada. No use nada apretado alrededor de la cintura que haga presin sobre el abdomen.  Levante (eleve) la cabecera de la cama aproximadamente 6pulgadas (15cm).  Evite inclinarse si al hacerlo empeoran los sntomas.  Concurra a todas las visitas de seguimiento como se lo haya indicado el mdico. Esto es importante. Comunquese con un mdico si:  Tiene los siguientes  sntomas: ? Sntomas nuevos. ? Prdida de peso sin causa aparente. ? Dificultad o dolor al tragar. ? Sibilancias o una tos persistente. ? Voz ronca.  Los sntomas no mejoran con Dispensing optician. Solicite ayuda inmediatamente si:  [employer], el cuello, la [place], los dientes o la espalda.  Se siente transpirado, mareado o tiene una sensacin de desvanecimiento.  Siente dolor intenso en el pecho o le falta el aire.  Vomita y el vmito tiene un aspecto similar a la sangre o a los posos de caf.  Se desmaya.  Tiene heces sanguinolentas o negras.  No puede tragar, beber o comer. Resumen  El reflujo gastroesofgico ocurre cuando el cido del estmago sube al esfago. La ERGE es una enfermedad en la que el reflujo ocurre con frecuencia, causa sntomas frecuentes o graves, o causa problemas tales como dao en el esfago.  El tratamiento de esta afeccin puede variar segn la gravedad de los sntomas. El mdico puede indicarle que siga una dieta y haga cambios en su estilo de vida, tome medicamentos o se someta a [place].  Comunquese con un mdico si tiene sntomas nuevos o los sntomas empeoran.  Tome los medicamentos de venta libre y los recetados solamente como se lo haya indicado el mdico. No tome aspirina, ibuprofeno ni otros AINE a menos que el  mdico se lo indique.  Concurra a todas las visitas de [address] se lo haya indicado el mdico. Esto es importante. Esta informacin no tiene Theme park manager el consejo del mdico. Asegrese de hacerle al mdico cualquier pregunta que tenga. Document Released: 06/16/2005 Document Revised: 04/20/2018 Document Reviewed: 04/20/2018 Elsevier Interactive Patient Education  2019 Elsevier Inc. Opciones de alimentos para pacientes adultos con enfermedad de reflujo gastroesofgico Food Choices for Gastroesophageal Reflux Disease, Adult Si tiene enfermedad de reflujo gastroesofgico (ERGE), los alimentos que  consume y los hbitos de alimentacin son Engineer, production. Elegir los alimentos adecuados puede ayudar a Paramedic las molestias ocasionadas por la [employer]. Considere la posibilidad de trabajar con un especialista en dieta y nutricin (nutricionista) que lo ayude a Software engineer saludables. Qu pautas generales debo seguir?  Plan de alimentacin  Elija alimentos saludables con bajo contenido de grasa, como frutas, verduras, cereales integrales, productos lcteos descremados, carne magra de vaca, de pescado y de ave.  Haga comidas pequeas con frecuencia en lugar de tres comidas abundantes al da. Coma lentamente, en un ambiente distendido. Evite agacharse o recostarse hasta despus de 2 o 3horas de haber comido.  Limite los alimentos con alto contenido graso como las carnes grasas o los alimentos fritos.  Limite el consumo de [place], [place] y [place] a menos de 8 cucharaditas al Futures trader.  Evite lo siguiente: ? Consumir alimentos que le ocasionen sntomas. Pueden ser distintos para cada persona. Lleve un registro de los alimentos para identificar aquellos que le ocasionen sntomas. ? Consumir alcohol. ? Beber grandes cantidades de lquido con las comidas. ? Comer 2 o 3 horas antes de acostarse.  Cocine los alimentos utilizando mtodos que no sean la fritura. Esto puede Writer, Technical sales engineer y hervir. Estilo de vida  Mantenga un peso saludable. Pregunte a su mdico cul es el peso saludable para usted. Si debe perder peso, hable con su mdico para hacerlo de manera segura.  Realice actividad fsica durante, al menos, 30 minutos 5 das por semana o ms, o segn lo indicado por su mdico.  Evite usar ropa ajustada alrededor de la cintura y Naval architect.  No consuma ningn producto que contenga nicotina o tabaco, como cigarrillos y Administrator, Civil Service. Si necesita ayuda para dejar de fumar, consulte al mdico.  Duerma con la cabecera de la cama elevada. Use una cua debajo del  colchn o bloques debajo del armazn de la cama para Pharmacologist la cabecera de la cama elevada. Qu alimentos no se recomiendan? Esta podra no ser [employer]. Hable con el nutricionista sobre las mejores opciones alimenticias para usted. Carbohidratos [place] o panes sin levadura con grasa agregada. Tostadas francesas. Verduras Verduras fritas en abundante aceite. Papas fritas. Cualquier verdura que est preparada con grasa agregada. Cualquier verdura que le ocasione sntomas. Para algunas personas, estas pueden incluir tomates y productos con tomate, [place], cebollas y [place], y rbanos picantes. Frutas Cualquier fruta que est preparada con grasa agregada. Cualquier fruta que le ocasione sntomas. Para algunas personas, estas pueden incluir, las frutas ctricas como naranja, pomelo, pia y limn. Carnes y otros alimentos ricos en protenas Carnes de alto contenido graso como carne grasa de vaca o cerdo, salchichas, costillas, Man, salchicha, salame y tocino. Carnes o protenas fritas, lo que incluye pescado frito y pollo frito. Nueces y [place] de frutos secos. Lcteos [place]he entera y [place] con chocolate. [employer]. Crema. Helados. Queso crema. Batidos con [employer]. Bebidas Caf y t negro, con o sin cafena. Bebidas carbonatadas. Refrescos. Bebidas  energizantes. Jugo de fruta hecho con frutas cidas (como naranja o pomelo). Jugo de tomate. Bebidas alcohlicas. Grasas y Barnes & Nobleaceites [place]. Margarina. Lardo. [place] clarificada. Dulces y postres Chocolate y cacao. [place]. Condimentos y otros alimentos [place]imienta. Menta y mentol. Cualquier condimento, hierbas o aderezos que le ocasionen sntomas. Para algunas personas, esto puede incluir curry, salsa picante o aderezos para ensalada a base de vinagre. Resumen  Si tiene enfermedad de reflujo gastroesofgico (ERGE), las elecciones de alimentos y [place]ilo de vida son muy importantes para ayudar a Paramedicaliviar las molestias de la  [place]GE.  Haga comidas pequeas con frecuencia en lugar de tres comidas abundantes al da. Coma lentamente, en un ambiente distendido. Evite agacharse o recostarse hasta despus de 2 o 3horas de haber comido.  Limite los alimentos con alto contenido graso como la carne grasa o los alimentos fritos. Esta informacin no tiene Theme park managercomo fin reemplazar el consejo del mdico. Asegrese de hacerle al mdico cualquier pregunta que tenga. Document Released: 06/16/2005 Document Revised: 12/27/2016 Document Reviewed: 07/11/2013 Elsevier Interactive Patient Education  2019 [employer]vier Inc.

## 2018-12-05 ENCOUNTER — Encounter: Payer: Self-pay | Admitting: Gastroenterology

## 2018-12-05 ENCOUNTER — Ambulatory Visit: Payer: BLUE CROSS/BLUE SHIELD | Admitting: Gastroenterology

## 2018-12-05 ENCOUNTER — Other Ambulatory Visit: Payer: Self-pay

## 2018-12-05 VITALS — BP 130/81 | HR 52 | Ht 65.0 in | Wt 182.0 lb

## 2018-12-05 DIAGNOSIS — K219 Gastro-esophageal reflux disease without esophagitis: Secondary | ICD-10-CM

## 2018-12-05 DIAGNOSIS — Z0000 Encounter for general adult medical examination without abnormal findings: Secondary | ICD-10-CM | POA: Diagnosis not present

## 2018-12-05 MED ORDER — OMEPRAZOLE 40 MG PO CPDR: 40.0000 mg | DELAYED_RELEASE_CAPSULE | Freq: Two times a day (BID) | ORAL | 0 refills | Status: DC

## 2018-12-05 NOTE — Progress Notes (Signed)
Taylor Repress, MD [address]  [address]  [place], [place] [address]  Main: [phone]  Fax: [phone]    Gastroenterology Consultation  Referring Provider:     Marya Fossa, PA-C Primary Care Physician:  Marya Fossa, PA-C Primary Gastroenterologist:  Dr. Arlyss Mullen Reason for Consultation:  Heartburn        HPI:   Taylor Mullen is a 38 y.o. Spanish-speaking male referred by Dr. Marya Fossa, PA-C  for consultation & management of heartburn.  Patient reports that he has been experiencing several years history of severe burning sensation in the chest associated with regurgitation.  He denies difficulty swallowing, epigastric pain, nausea, weight loss.  He was started on omeprazole 20 mg once daily which did not provide any relief.  He tried twice daily temporarily.  He denies smoking or drinking alcohol.  He denies any recent weight gain. He had laboratory tests performed by his PCP about a month ago and reportedly normal  Follow-up visit 10/03/2018 Patient underwent EGD which was unremarkable including normal gastric biopsies and no evidence of eosinophilic esophagitis.  Patient is taking omeprazole 40 mg twice daily before meals.  He continues to have regurgitation although not heartburn.  His weight has been stable.  Follow-up visit 12/05/2018 Patient reports that as long as he is taking omeprazole 40 mg twice daily, he does not have symptoms.  He wants to know how long he has to be on the medication and if it is safe to stay on the medication.  He denies dysphagia, weight loss  He denies family history of GI malignancy  NSAIDs: None  Antiplts/Anticoagulants/Anti thrombotics: None  GI Procedures:  EGD 08/01/2018 - Normal duodenal bulb and second portion of the duodenum. 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 - Erythematous mucosa in the prepyloric region of the stomach. - Normal gastric body. Biopsied. -  Z-line irregular, 35 cm from the incisors. - Normal esophagus. Biopsied.  DIAGNOSIS:  A. 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  - NEGATIVE FOR H. PYLORI, DYSPLASIA, AND MALIGNANCY.   B. ESOPHAGUS, DISTAL; COLD BIOPSY:  - UNREMARKABLE SQUAMOUS MUCOSA.  - NEGATIVE FOR EOSINOPHILS, DYSPLASIA, AND MALIGNANCY.   He denies any GI surgeries  Past Medical History:  Diagnosis Date  . GERD (gastroesophageal reflux disease)     Current Outpatient Medications:  .  omeprazole (PRILOSEC) 40 MG capsule, Take 1 capsule (40 mg total) by mouth 2 (two) times daily before a meal., Disp: 180 capsule, Rfl: 0  History reviewed. No pertinent family history.   Social History   Tobacco Use  . Smoking status: Never Smoker  . Smokeless tobacco: Never Used  Substance Use Topics  . Alcohol use: Never    Frequency: Never  . Drug use: Never    Allergies as of 12/05/2018  . (No Known Allergies)    Review of Systems:    All systems reviewed and negative except where noted in HPI.   Physical Exam:  BP 130/81   Pulse (!) 52   Ht 5\' 5"  (1.651 m)   Wt 182 lb (82.6 kg)   BMI 30.29 kg/m  No LMP for male patient.  General:   Alert,  Well-developed, well-nourished, pleasant and cooperative in NAD Head:  Normocephalic and atraumatic. Eyes:  Sclera clear, no icterus.   Conjunctiva pink. Ears:  Normal auditory acuity. Nose:  No deformity, discharge,  or lesions. Mouth:  No deformity or lesions,oropharynx pink & moist. Neck:  Supple; no masses or thyromegaly. Lungs:  Respirations even and unlabored.  Clear throughout to auscultation.   No wheezes, crackles, or rhonchi. No acute distress. Heart:  Regular rate and rhythm; no murmurs, clicks, rubs, or gallops. Abdomen:  Normal bowel sounds. Soft, non-tender and non-distended without masses, hepatosplenomegaly or hernias noted.  No guarding or rebound tenderness.   Rectal: Not  performed Msk:  Symmetrical without gross deformities. Good, equal movement & strength bilaterally. Pulses:  Normal pulses noted. Extremities:  No clubbing or edema.  No cyanosis. Neurologic:  Alert and oriented x3;  grossly normal neurologically. Skin:  Intact without significant lesions or rashes. No jaundice. Lymph Nodes:  No significant cervical adenopathy. Psych:  Alert and cooperative. Normal mood and affect.  Imaging Studies: None  Assessment and Plan:   Taylor Mullen is a 38 y.o. Spanish-speaking male with chronic heartburn and regurgitation, symptoms partially improved on omeprazole 40 mg twice daily.   GERD: EGD with no evidence of erosive esophagitis or eosinophilic esophagitis Continue Prilosec 40 mg twice daily Antireflux lifestyle, information provided in Spanish Highly encouraged him to lose 10 to 15 pounds Check CBC, CMP Since his PPI responsive and optimal therapy, recommend pH impedance and esophageal manometry for evaluation of possible antireflux surgery.  I will refer him to [facility] for this testing If his symptoms are persistent and above tests negative, will evaluate for gallbladder etiology   Follow up in 3 months   Taylor Repress, MD

## 2018-12-06 LAB — COMPREHENSIVE METABOLIC PANEL
A/G RATIO: 1.4 (ref 1.2–2.2)
ALBUMIN: 4.6 g/dL (ref 4.0–5.0)
ALT: 50 IU/L — AB (ref 0–44)
AST: 21 IU/L (ref 0–40)
Alkaline Phosphatase: 100 IU/L (ref 39–117)
BILIRUBIN TOTAL: 0.3 mg/dL (ref 0.0–1.2)
BUN / CREAT RATIO: 12 (ref 9–20)
BUN: 8 mg/dL (ref 6–20)
CALCIUM: 9.7 mg/dL (ref 8.7–10.2)
CO2: 24 mmol/L (ref 20–29)
Chloride: 102 mmol/L (ref 96–106)
Creatinine, Ser: 0.66 mg/dL — ABNORMAL LOW (ref 0.76–1.27)
GFR calc non Af Amer: 123 mL/min/{1.73_m2} (ref >59)
GFR, EST AFRICAN AMERICAN: 142 mL/min/{1.73_m2} (ref >59)
GLOBULIN, TOTAL: 3.2 g/dL (ref 1.5–4.5)
GLUCOSE: 99 mg/dL (ref 65–99)
POTASSIUM: 4.6 mmol/L (ref 3.5–5.2)
SODIUM: 139 mmol/L (ref 134–144)
TOTAL PROTEIN: 7.8 g/dL (ref 6.0–8.5)

## 2018-12-06 LAB — CBC
HEMATOCRIT: 42.9 % (ref 37.5–51.0)
HEMOGLOBIN: 15.1 g/dL (ref 13.0–17.7)
MCH: 30.1 pg (ref 26.6–33.0)
MCHC: 35.2 g/dL (ref 31.5–35.7)
MCV: 86 fL (ref 79–97)
Platelets: 237 10*3/uL (ref 150–450)
RBC: 5.01 x10E6/uL (ref 4.14–5.80)
RDW: 13 % (ref 11.6–15.4)
WBC: 5.7 10*3/uL (ref 3.4–10.8)

## 2018-12-08 ENCOUNTER — Other Ambulatory Visit: Payer: Self-pay

## 2018-12-08 DIAGNOSIS — R748 Abnormal levels of other serum enzymes: Secondary | ICD-10-CM

## 2019-01-05 ENCOUNTER — Other Ambulatory Visit: Payer: Self-pay

## 2019-01-05 ENCOUNTER — Ambulatory Visit
Admission: RE | Admit: 2019-01-05 | Discharge: 2019-01-05 | Disposition: A | Discharge status: Home / Self Care | Payer: BLUE CROSS/BLUE SHIELD | Source: Ambulatory Visit | Attending: Gastroenterology | Admitting: Gastroenterology

## 2019-01-05 DIAGNOSIS — R748 Abnormal levels of other serum enzymes: Secondary | ICD-10-CM | POA: Diagnosis present

## 2019-03-08 ENCOUNTER — Encounter (INDEPENDENT_AMBULATORY_CARE_PROVIDER_SITE_OTHER): Payer: Self-pay

## 2019-03-08 ENCOUNTER — Encounter: Payer: Self-pay | Admitting: Gastroenterology

## 2019-03-08 ENCOUNTER — Ambulatory Visit: Payer: BLUE CROSS/BLUE SHIELD | Admitting: Gastroenterology

## 2019-03-08 ENCOUNTER — Other Ambulatory Visit: Payer: Self-pay

## 2019-03-08 VITALS — BP 120/73 | HR 58 | Temp 98.8°F | Resp 16 | Ht 65.0 in | Wt 180.0 lb

## 2019-03-08 DIAGNOSIS — R7401 Elevation of levels of liver transaminase levels: Secondary | ICD-10-CM

## 2019-03-08 DIAGNOSIS — R740 Nonspecific elevation of levels of transaminase and lactic acid dehydrogenase [LDH]: Secondary | ICD-10-CM

## 2019-03-08 DIAGNOSIS — K219 Gastro-esophageal reflux disease without esophagitis: Secondary | ICD-10-CM

## 2019-03-08 MED ORDER — OMEPRAZOLE 40 MG PO CPDR: 40.0000 mg | DELAYED_RELEASE_CAPSULE | Freq: Two times a day (BID) | ORAL | 2 refills | Status: DC

## 2019-03-08 NOTE — Progress Notes (Signed)
Arlyss Repressohini R Xavious Sharrar, MD [address]  [address]  [place], [place] [address]  Main: [phone]  Fax: [phone]    Gastroenterology Consultation  Referring Provider:     Marya FossaLeyva, Teresa, PA-C Primary Care Physician:  Marya FossaLeyva, Teresa, PA-C Primary Gastroenterologist:  Dr. Arlyss Repressohini R Bobak Oguinn Reason for Consultation: Chronic GERD        HPI:   Thornton PapasJuan Jacobo Naji is a 38 y.o. Spanish-speaking male referred by Dr. Marya FossaLeyva, Teresa, PA-C  for consultation & management of heartburn.  Patient reports that he has been experiencing several years history of severe burning sensation in the chest associated with regurgitation.  He denies difficulty swallowing, epigastric pain, nausea, weight loss.  He was started on omeprazole 20 mg once daily which did not provide any relief.  He tried twice daily temporarily.  He denies smoking or drinking alcohol.  He denies any recent weight gain. He had laboratory tests performed by his PCP about a month ago and reportedly normal  Follow-up visit 10/03/2018 Patient underwent EGD which was unremarkable including normal gastric biopsies and no evidence of eosinophilic esophagitis.  Patient is taking omeprazole 40 mg twice daily before meals.  He continues to have regurgitation although not heartburn.  His weight has been stable.  Follow-up visit 12/05/2018 Patient reports that as long as he is taking omeprazole 40 mg twice daily, he does not have symptoms.  He wants to know how long he has to be on the medication and if it is safe to stay on the medication.  He denies dysphagia, weight loss  Follow-up visit 03/08/2019 Patient is here for follow-up of chronic GERD, PPI dependent as well as fatty liver He is found to have mildly elevated ALT, ultrasound liver revealed diffuse hepatic steatosis.  Patient denies high risk behaviors for hepatitis C, hepatitis B. he reports that his reflux symptoms are under control as long as he is taking omeprazole 40 mg twice daily.  Has  not tried making changes in his lifestyle, like adapting exercise and cutting back on high fatty and high carbohydrate foods  He denies family history of GI malignancy  NSAIDs: None  Antiplts/Anticoagulants/Anti thrombotics: None  GI Procedures:  EGD 08/01/2018 - Normal duodenal bulb and second portion of the duodenum. 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 - Erythematous mucosa in the prepyloric region of the stomach. - Normal gastric body. Biopsied. - Z-line irregular, 35 cm from the incisors. - Normal esophagus. Biopsied.  DIAGNOSIS:  A. 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  - NEGATIVE FOR H. PYLORI, DYSPLASIA, AND MALIGNANCY.   B. ESOPHAGUS, DISTAL; COLD BIOPSY:  - UNREMARKABLE SQUAMOUS MUCOSA.  - NEGATIVE FOR EOSINOPHILS, DYSPLASIA, AND MALIGNANCY.   He denies any GI surgeries  Past Medical History:  Diagnosis Date   GERD (gastroesophageal reflux disease)     Current Outpatient Medications:    omeprazole (PRILOSEC) 40 MG capsule, Take 1 capsule (40 mg total) by mouth 2 (two) times daily before a meal., Disp: 180 capsule, Rfl: 2  No family history on file.   Social History   Tobacco Use   Smoking status: Never Smoker   Smokeless tobacco: Never Used  Substance Use Topics   Alcohol use: Never    Frequency: Never   Drug use: Never    Allergies as of 03/08/2019   (No Known Allergies)    Review of Systems:  All systems reviewed and negative except where noted in HPI.   Physical Exam:  BP 120/73 (BP Location: Left Arm, Patient Position: Sitting, Cuff Size: Normal)    Pulse (!) 58    Temp 98.8 F (37.1 C)    Resp 16    Ht 5\' 5"  (1.651 m)    Wt 180 lb (81.6 kg)    BMI 29.95 kg/m  No LMP for male patient.  General:   Alert,  Well-developed, well-nourished, pleasant and cooperative in NAD Head:  Normocephalic and atraumatic. Eyes:   Sclera clear, no icterus.   Conjunctiva pink. Ears:  Normal auditory acuity. Nose:  No deformity, discharge, or lesions. Mouth:  No deformity or lesions,oropharynx pink & moist. Neck:  Supple; no masses or thyromegaly. Lungs:  Respirations even and unlabored.  Clear throughout to auscultation.   No wheezes, crackles, or rhonchi. No acute distress. Heart:  Regular rate and rhythm; no murmurs, clicks, rubs, or gallops. Abdomen:  Normal bowel sounds. Soft, non-tender and non-distended without masses, hepatosplenomegaly or hernias noted.  No guarding or rebound tenderness.   Rectal: Not performed Msk:  Symmetrical without gross deformities. Good, equal movement & strength bilaterally. Pulses:  Normal pulses noted. Extremities:  No clubbing or edema.  No cyanosis. Neurologic:  Alert and oriented x3;  grossly normal neurologically. Skin:  Intact without significant lesions or rashes. No jaundice. Psych:  Alert and cooperative. Normal mood and affect.  Imaging Studies: None  Assessment and Plan:   Gurjit Loconte is a 38 y.o. Spanish-speaking male with chronic heartburn and regurgitation, symptoms partially improved on omeprazole 40 mg twice daily.   GERD: EGD with no evidence of erosive esophagitis or eosinophilic esophagitis Continue Prilosec 40 mg twice daily Antireflux lifestyle, information provided in Spanish Highly encouraged him to lose 10 to 15 pounds Since his GERD is PPI dependent and on optimal therapy, recommend pH impedance and esophageal manometry for evaluation of possible antireflux surgery.  I will refer him to [facility] for this testing  Fatty liver Recheck LFTs Reiterated to him about healthy lifestyle, exercise Check viral hepatitis panel   Follow up in 4 months   Cephas Darby, MD

## 2019-03-09 LAB — HEPATITIS PANEL, ACUTE
Hep A IgM: NEGATIVE
Hep B C IgM: NEGATIVE
Hep C Virus Ab: 0.1 s/co ratio (ref 0.0–0.9)
Hepatitis B Surface Ag: NEGATIVE

## 2019-03-09 LAB — HEPATIC FUNCTION PANEL
ALT: 64 IU/L — ABNORMAL HIGH (ref 0–44)
AST: 29 IU/L (ref 0–40)
Albumin: 4.5 g/dL (ref 4.0–5.0)
Alkaline Phosphatase: 98 IU/L (ref 39–117)
Bilirubin Total: 0.2 mg/dL (ref 0.0–1.2)
Bilirubin, Direct: 0.08 mg/dL (ref 0.00–0.40)
Total Protein: 7.3 g/dL (ref 6.0–8.5)

## 2019-07-05 ENCOUNTER — Ambulatory Visit: Payer: BC Managed Care – PPO | Admitting: Gastroenterology

## 2019-07-10 ENCOUNTER — Encounter: Payer: Self-pay | Admitting: Gastroenterology

## 2019-07-10 ENCOUNTER — Other Ambulatory Visit: Payer: Self-pay

## 2019-07-10 ENCOUNTER — Ambulatory Visit: Payer: BC Managed Care – PPO | Admitting: Gastroenterology

## 2019-07-10 ENCOUNTER — Encounter (INDEPENDENT_AMBULATORY_CARE_PROVIDER_SITE_OTHER): Payer: Self-pay

## 2019-07-10 VITALS — BP 108/68 | HR 65 | Temp 98.3°F | Resp 16 | Ht 65.0 in | Wt 176.4 lb

## 2019-07-10 DIAGNOSIS — R748 Abnormal levels of other serum enzymes: Secondary | ICD-10-CM | POA: Diagnosis not present

## 2019-07-10 DIAGNOSIS — E6609 Other obesity due to excess calories: Secondary | ICD-10-CM

## 2019-07-10 DIAGNOSIS — K760 Fatty (change of) liver, not elsewhere classified: Secondary | ICD-10-CM

## 2019-07-10 DIAGNOSIS — K219 Gastro-esophageal reflux disease without esophagitis: Secondary | ICD-10-CM

## 2019-07-10 DIAGNOSIS — Z6833 Body mass index (BMI) 33.0-33.9, adult: Secondary | ICD-10-CM

## 2019-07-10 NOTE — Progress Notes (Signed)
Cephas Darby, MD [address]  [place]  [place], [place] [address]  Main: [phone]  Fax: [phone]    Gastroenterology Consultation  Referring Provider:     Kerri Perches, PA-C Primary Care Physician:  Kerri Perches, PA-C Primary Gastroenterologist:  Dr. Cephas Darby Reason for Consultation: Chronic GERD, fatty liver        HPI:   Taylor Mullen is a 38 y.o. Spanish-speaking male referred by Dr. Kerri Perches, PA-C  for consultation & management of heartburn.  Patient reports that he has been experiencing several years history of severe burning sensation in the chest associated with regurgitation.  He denies difficulty swallowing, epigastric pain, nausea, weight loss.  He was started on omeprazole 20 mg once daily which did not provide any relief.  He tried twice daily temporarily.  He denies smoking or drinking alcohol.  He denies any recent weight gain. He had laboratory tests performed by his PCP about a month ago and reportedly normal  Follow-up visit 10/03/2018 Patient underwent EGD which was unremarkable including normal gastric biopsies and no evidence of eosinophilic esophagitis.  Patient is taking omeprazole 40 mg twice daily before meals.  He continues to have regurgitation although not heartburn.  His weight has been stable.  Follow-up visit 12/05/2018 Patient reports that as long as he is taking omeprazole 40 mg twice daily, he does not have symptoms.  He wants to know how long he has to be on the medication and if it is safe to stay on the medication.  He denies dysphagia, weight loss  Follow-up visit 03/08/2019 Patient is here for follow-up of chronic GERD, PPI dependent as well as fatty liver He is found to have mildly elevated ALT, ultrasound liver revealed diffuse hepatic steatosis.  Patient denies high risk behaviors for hepatitis C, hepatitis B. he reports that his reflux symptoms are under control as long as he is taking omeprazole 40 mg twice  daily.  Has not tried making changes in his lifestyle, like adapting exercise and cutting back on high fatty and high carbohydrate foods  Follow-up visit 07/10/2019 Patient did not undergo pH impedance and esophageal manometry at [facility], he could not schedule those appointments as he is busy at work.  He continues to take omeprazole 40 mg 2 times a day.  He lost about 46 pounds since last visit.  His ultrasound revealed fatty liver.  He does not have any other concerns today  He denies family history of GI malignancy  NSAIDs: None  Antiplts/Anticoagulants/Anti thrombotics: None  GI Procedures:  EGD 08/01/2018 - Normal duodenal bulb and second portion of the duodenum. 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 - Erythematous mucosa in the prepyloric region of the stomach. - Normal gastric body. Biopsied. - Z-line irregular, 35 cm from the incisors. - Normal esophagus. Biopsied.  DIAGNOSIS:  A. 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  - NEGATIVE FOR H. PYLORI, DYSPLASIA, AND MALIGNANCY.   B. ESOPHAGUS, DISTAL; COLD BIOPSY:  - UNREMARKABLE SQUAMOUS MUCOSA.  - NEGATIVE FOR EOSINOPHILS, DYSPLASIA, AND MALIGNANCY.   He denies any GI surgeries  Past Medical History:  Diagnosis Date  . GERD (gastroesophageal reflux disease)     Current Outpatient Medications:  .  HYDROcodone-acetaminophen (NORCO) 7.5-325 MG tablet, TAKE 1 TABLET BY MOUTH EVERY 4 TO 6 HOURS AS NEEDED FOR PAIN, Disp: , Rfl:  .  omeprazole (  PRILOSEC) 40 MG capsule, Take 1 capsule (40 mg total) by mouth 2 (two) times daily before a meal., Disp: 180 capsule, Rfl: 2  No family history on file.   Social History   Tobacco Use  . Smoking status: Never Smoker  . Smokeless tobacco: Never Used  Substance Use Topics  . Alcohol use: Never    Frequency: Never  . Drug use: Never    Allergies as of 07/10/2019  .  (No Known Allergies)    Review of Systems:    All systems reviewed and negative except where noted in HPI.   Physical Exam:  BP 108/68 (BP Location: Left Arm, Patient Position: Sitting, Cuff Size: Large)   Pulse 65   Temp 98.3 F (36.8 C)   Resp 16   Ht 5\' 5"  (1.651 m)   Wt 176 lb 6.4 oz (80 kg)   BMI 29.35 kg/m  No LMP for male patient.  General:   Alert,  Well-developed, well-nourished, pleasant and cooperative in NAD Head:  Normocephalic and atraumatic. Eyes:  Sclera clear, no icterus.   Conjunctiva pink. Ears:  Normal auditory acuity. Nose:  No deformity, discharge, or lesions. Mouth:  No deformity or lesions,oropharynx pink & moist. Neck:  Supple; no masses or thyromegaly. Lungs:  Respirations even and unlabored.  Clear throughout to auscultation.   No wheezes, crackles, or rhonchi. No acute distress. Heart:  Regular rate and rhythm; no murmurs, clicks, rubs, or gallops. Abdomen:  Normal bowel sounds. Soft, non-tender and non-distended without masses, hepatosplenomegaly or hernias noted.  No guarding or rebound tenderness.   Rectal: Not performed Msk:  Symmetrical without gross deformities. Good, equal movement & strength bilaterally. Pulses:  Normal pulses noted. Extremities:  No clubbing or edema.  No cyanosis. Neurologic:  Alert and oriented x3;  grossly normal neurologically. Skin:  Intact without significant lesions or rashes. No jaundice. Psych:  Alert and cooperative. Normal mood and affect.  Imaging Studies: None  Assessment and Plan:   Taylor Mullen is a 38 y.o. Spanish-speaking male with chronic heartburn and regurgitation, symptoms partially improved on omeprazole 40 mg twice daily.   GERD: EGD with no evidence of erosive esophagitis or eosinophilic esophagitis Reassured him that EGD is unremarkable, he can try to wean off PPI at this time Continue antireflux lifestyle, information provided in Spanish Highly encouraged him to lose 10 to 15 pounds  Advised him to call [facility] based on his flexibility at his work to undergo pH impedance and esophageal manometry for evaluation of possible antireflux surgery.    Fatty liver, persistently elevated ALT Acute hepatitis panel negative Perform rest of secondary liver disease work-up Reiterated to him about healthy lifestyle, exercise Recheck LFTs in 6 months   Follow up in 6 months   [facility], MD

## 2019-07-11 LAB — ALPHA-1-ANTITRYPSIN: A-1 Antitrypsin: 115 mg/dL (ref 95–164)

## 2019-07-11 LAB — FERRITIN: Ferritin: 109 ng/mL (ref 30–400)

## 2019-07-11 LAB — MITOCHONDRIAL/SMOOTH MUSCLE AB PNL
Mitochondrial Ab: <20 Units (ref 0.0–20.0)
Smooth Muscle Ab: 6 Units (ref 0–19)

## 2019-07-11 LAB — IRON AND TIBC
Iron Saturation: 28 % (ref 15–55)
Iron: 71 ug/dL (ref 38–169)
Total Iron Binding Capacity: 254 ug/dL (ref 250–450)
UIBC: 183 ug/dL (ref 111–343)

## 2019-07-11 LAB — CERULOPLASMIN: Ceruloplasmin: 19.4 mg/dL (ref 16.0–31.0)

## 2019-07-11 LAB — ANA

## 2019-07-11 LAB — TSH: TSH: 1.74 u[IU]/mL (ref 0.450–4.500)

## 2019-11-14 IMAGING — US ULTRASOUND ABDOMEN LIMITED
1 series · 14 of 25 positions shown · non-contrast
Comparison: None.

CLINICAL DATA: Elevated liver enzymes

EXAM:
ULTRASOUND ABDOMEN LIMITED RIGHT UPPER QUADRANT

[Series 1: ultrasound abdomen limited · 0.21mm/px · 14 of 37 slices shown]
[im 1/37]
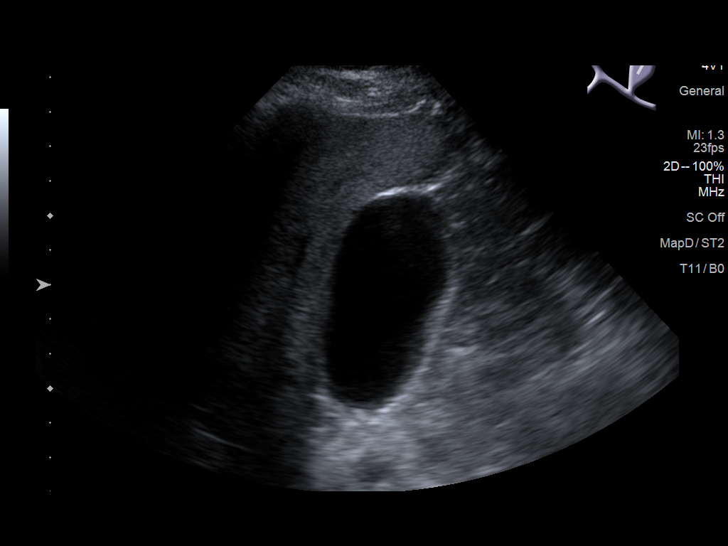
[im 4/37]
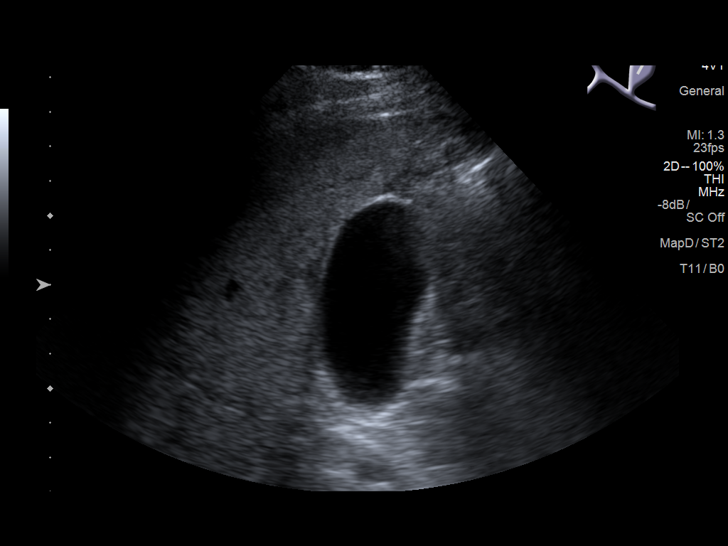
[im 7/37]
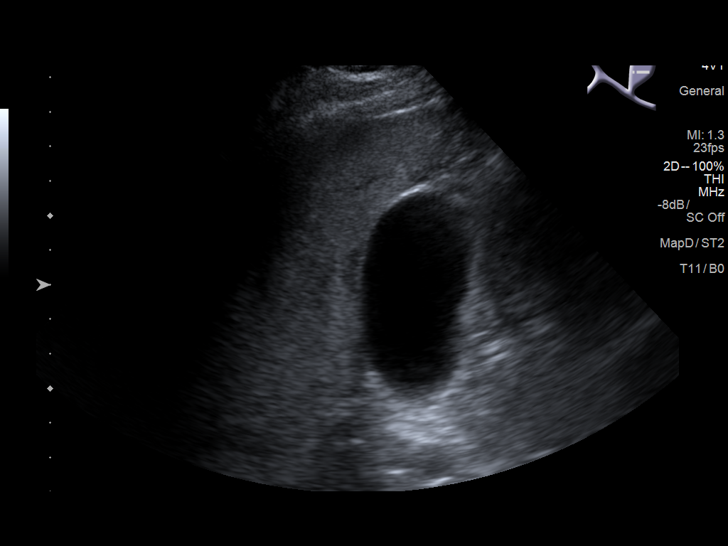
[im 10/37]
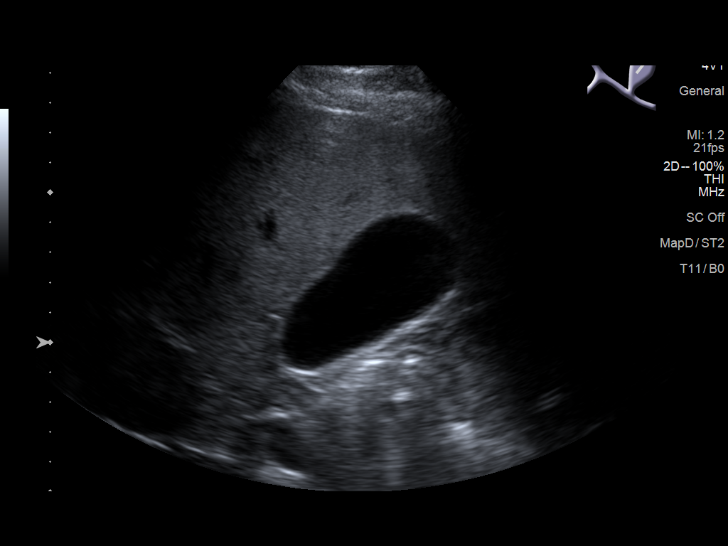
[im 13/37]
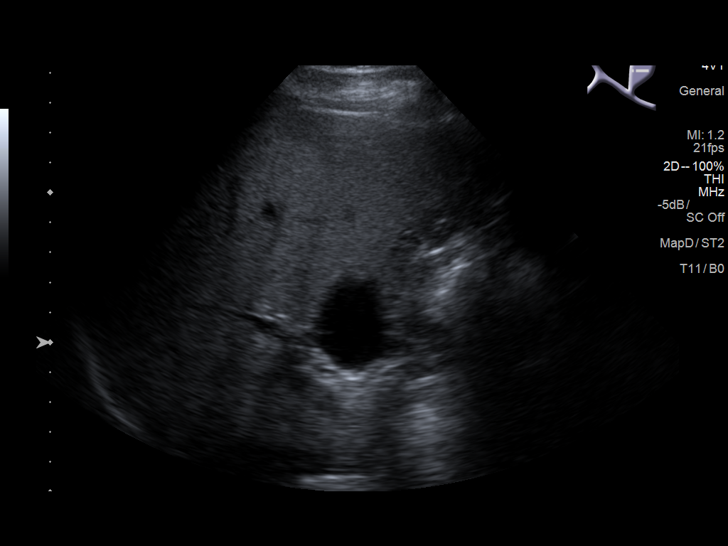
[im 14/37]
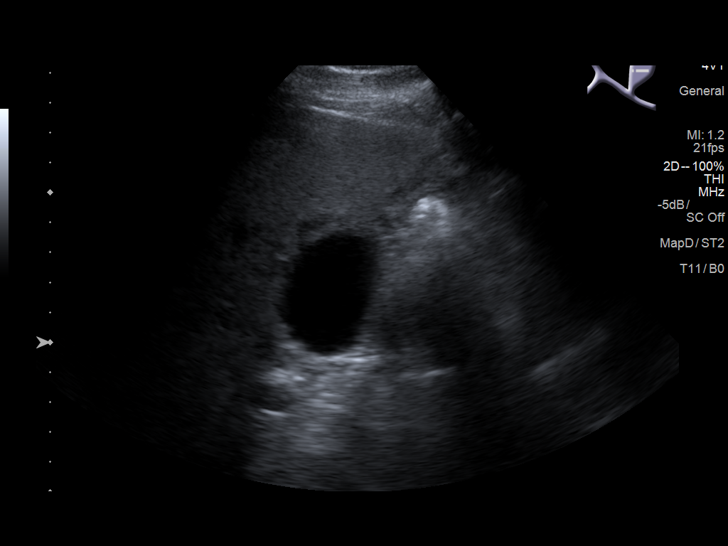
[im 17/37]
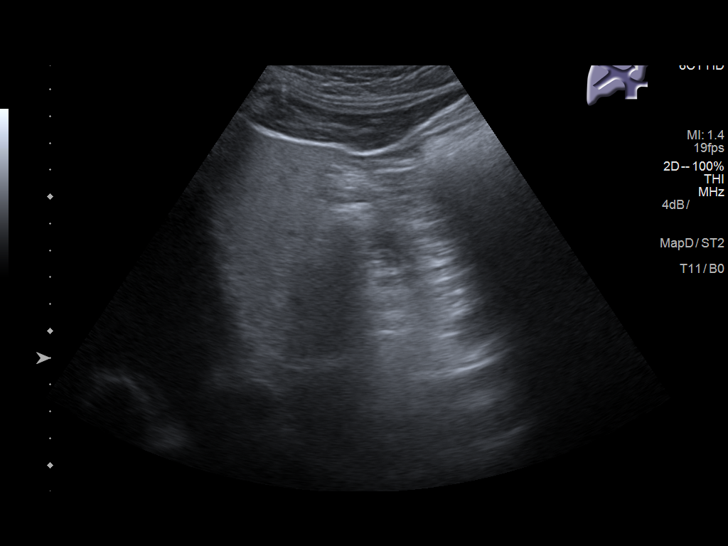
[im 20/37]
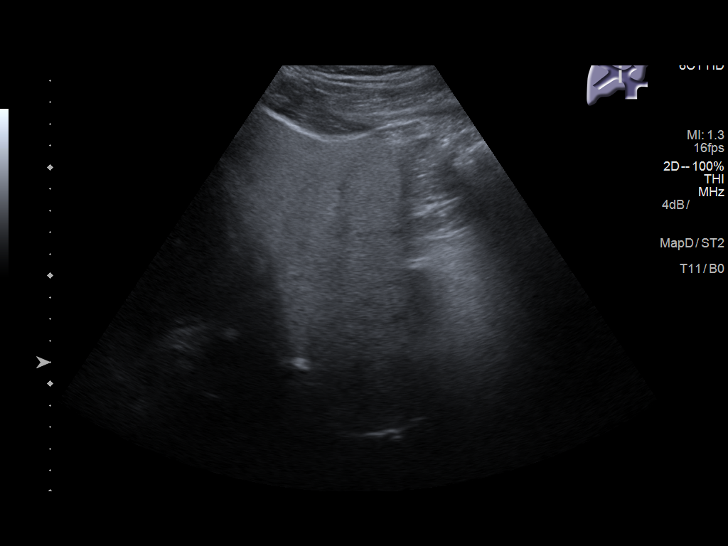
[im 23/37]
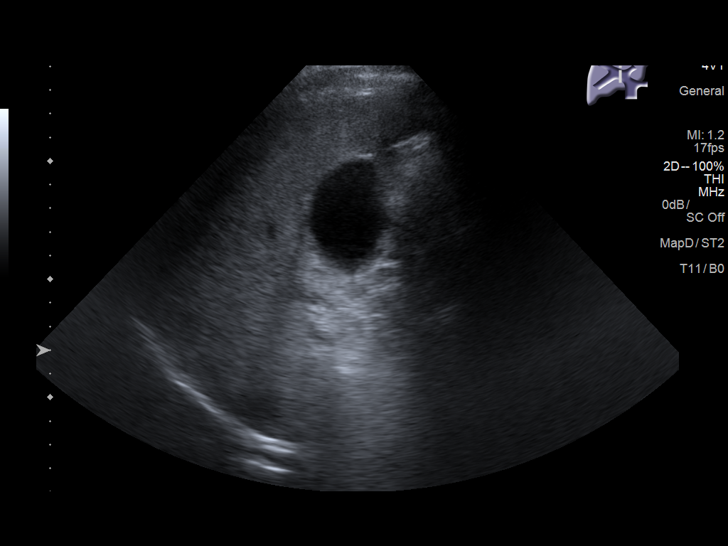
[im 25/37]
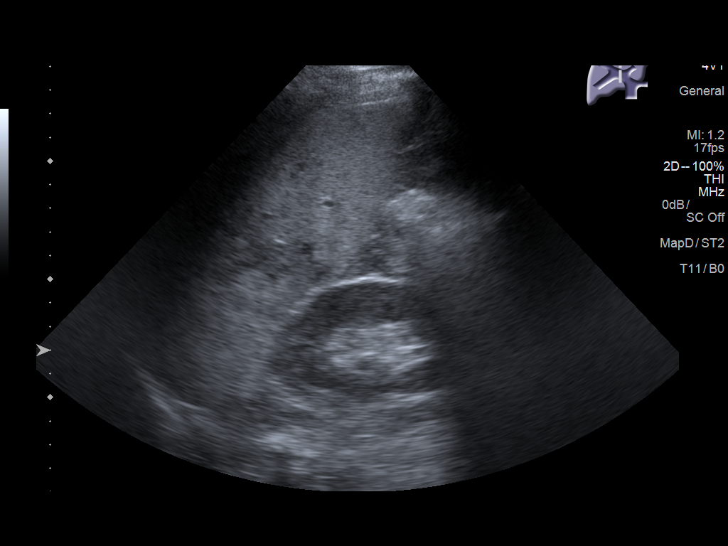
[im 28/37]
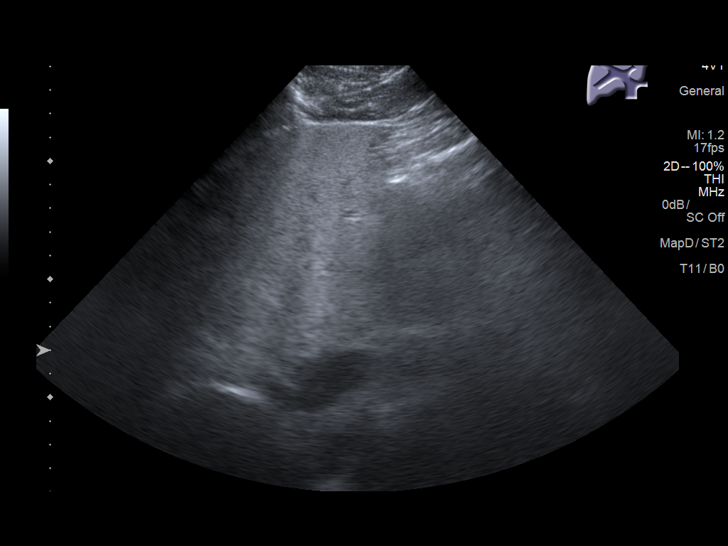
[im 31/37]
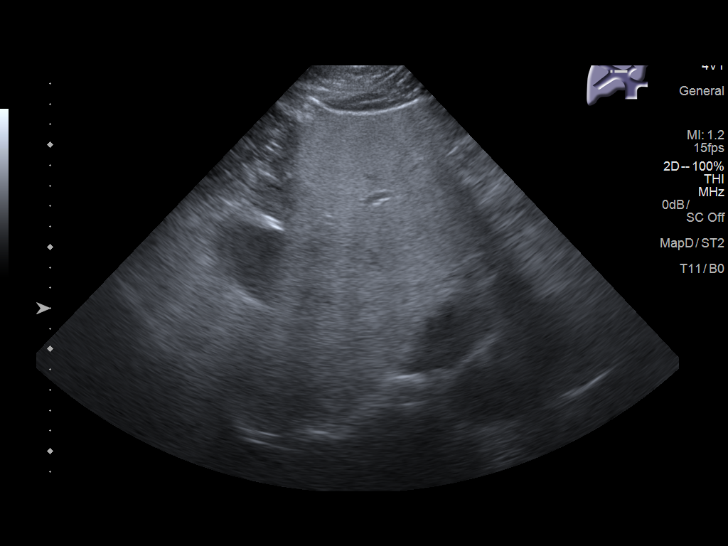
[im 34/37]
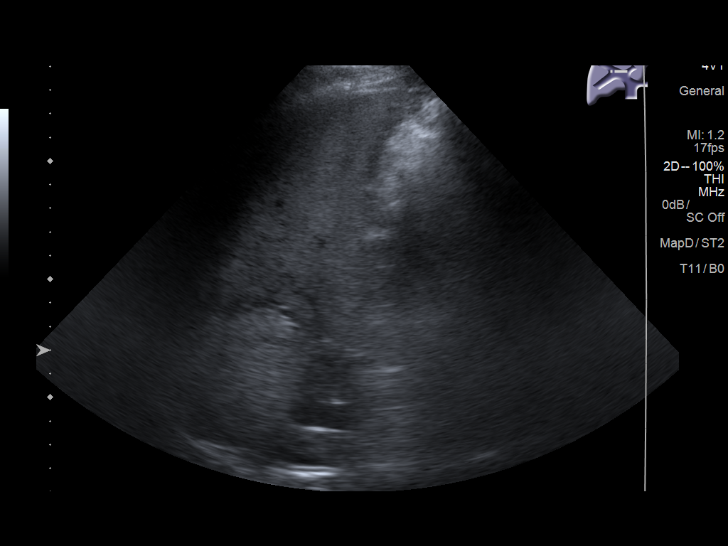
[im 37/37]
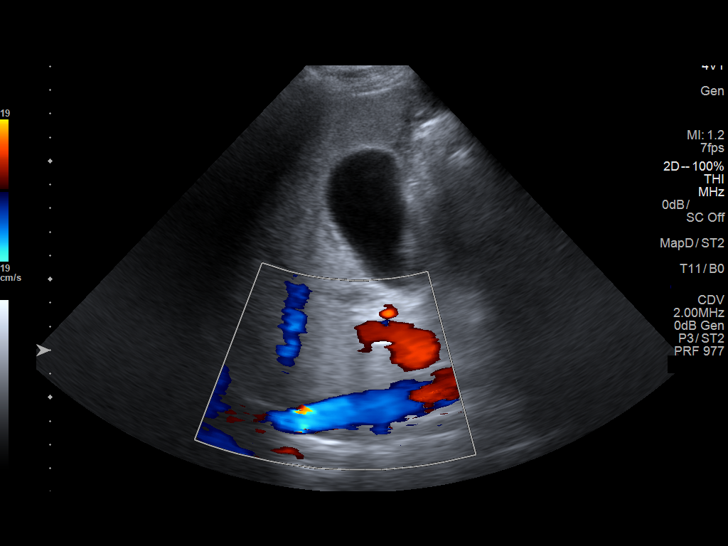

[14 of 25 positions shown; findings below may reference images not displayed]

FINDINGS: Gallbladder:

No gallstones or wall thickening visualized. There is no
pericholecystic fluid. No sonographic Murphy sign noted by
sonographer.

Common bile duct:

Diameter: 4 mm. No intrahepatic or extrahepatic biliary duct
dilatation.

Liver:

No focal lesion identified. Liver echogenicity is increased
diffusely. There is slight fatty sparing near the gallbladder
fossa. Portal vein is patent on color Doppler imaging with normal
direction of blood flow towards the liver.
IMPRESSION: Diffuse increased echogenicity throughout the liver, a finding felt
to be indicative of hepatic steatosis. While no focal liver lesions
are evident on this study, it must be cautioned that the sensitivity
of ultrasound for detection of focal liver lesions is diminished in
this circumstance.

Study otherwise unremarkable.

## 2022-04-08 ENCOUNTER — Observation Stay
Admission: EM | Admit: 2022-04-08 | Discharge: 2022-04-10 | Disposition: A | Discharge status: Home / Self Care | Payer: BC Managed Care – PPO | Attending: Surgery | Admitting: Surgery

## 2022-04-08 ENCOUNTER — Encounter: Payer: Self-pay | Admitting: Emergency Medicine

## 2022-04-08 ENCOUNTER — Other Ambulatory Visit: Payer: Self-pay

## 2022-04-08 ENCOUNTER — Emergency Department: Payer: BC Managed Care – PPO

## 2022-04-08 DIAGNOSIS — Z79899 Other long term (current) drug therapy: Secondary | ICD-10-CM | POA: Diagnosis not present

## 2022-04-08 DIAGNOSIS — K56609 Unspecified intestinal obstruction, unspecified as to partial versus complete obstruction: Principal | ICD-10-CM | POA: Insufficient documentation

## 2022-04-08 DIAGNOSIS — R109 Unspecified abdominal pain: Secondary | ICD-10-CM | POA: Diagnosis present

## 2022-04-08 LAB — CBC
HCT: 44.2 % (ref 39.0–52.0)
Hemoglobin: 15 g/dL (ref 13.0–17.0)
MCH: 30.4 pg (ref 26.0–34.0)
MCHC: 33.9 g/dL (ref 30.0–36.0)
MCV: 89.5 fL (ref 80.0–100.0)
Platelets: 253 10*3/uL (ref 150–400)
RBC: 4.94 MIL/uL (ref 4.22–5.81)
RDW: 12.3 % (ref 11.5–15.5)
WBC: 9.4 10*3/uL (ref 4.0–10.5)
nRBC: 0 % (ref 0.0–0.2)

## 2022-04-08 LAB — URINALYSIS, ROUTINE W REFLEX MICROSCOPIC
Bacteria, UA: NONE SEEN
Bilirubin Urine: NEGATIVE
Glucose, UA: NEGATIVE mg/dL
Hgb urine dipstick: NEGATIVE
Ketones, ur: 5 mg/dL — AB
Leukocytes,Ua: NEGATIVE
Nitrite: NEGATIVE
Protein, ur: 100 mg/dL — AB
Specific Gravity, Urine: 1.028 (ref 1.005–1.030)
pH: 9 — ABNORMAL HIGH (ref 5.0–8.0)

## 2022-04-08 LAB — COMPREHENSIVE METABOLIC PANEL
ALT: 47 U/L — ABNORMAL HIGH (ref 0–44)
AST: 22 U/L (ref 15–41)
Albumin: 4.6 g/dL (ref 3.5–5.0)
Alkaline Phosphatase: 79 U/L (ref 38–126)
Anion gap: 8 (ref 5–15)
BUN: 16 mg/dL (ref 6–20)
CO2: 29 mmol/L (ref 22–32)
Calcium: 9.4 mg/dL (ref 8.9–10.3)
Chloride: 101 mmol/L (ref 98–111)
Creatinine, Ser: 0.81 mg/dL (ref 0.61–1.24)
GFR, Estimated: >60 mL/min (ref >60)
Glucose, Bld: 134 mg/dL — ABNORMAL HIGH (ref 70–99)
Potassium: 4.1 mmol/L (ref 3.5–5.1)
Sodium: 138 mmol/L (ref 135–145)
Total Bilirubin: 1 mg/dL (ref 0.3–1.2)
Total Protein: 7.9 g/dL (ref 6.5–8.1)

## 2022-04-08 LAB — LIPASE, BLOOD: Lipase: 28 U/L (ref 11–51)

## 2022-04-08 MED ORDER — SODIUM CHLORIDE 0.9 % IV BOLUS
1000.0000 mL | Freq: Once | INTRAVENOUS | Status: AC
  Administered 2022-04-08: 1000 mL via INTRAVENOUS

## 2022-04-08 MED ORDER — HYDROMORPHONE HCL 1 MG/ML IJ SOLN
1.0000 mg | Freq: Once | INTRAMUSCULAR | Status: AC
  Administered 2022-04-08: 1 mg via INTRAVENOUS
  Filled 2022-04-08: qty 1

## 2022-04-08 MED ORDER — IOHEXOL 300 MG/ML  SOLN
100.0000 mL | Freq: Once | INTRAMUSCULAR | Status: AC | PRN
  Administered 2022-04-08: 100 mL via INTRAVENOUS

## 2022-04-08 MED ORDER — ONDANSETRON HCL 4 MG/2ML IJ SOLN
4.0000 mg | Freq: Once | INTRAMUSCULAR | Status: AC
  Administered 2022-04-09: 4 mg via INTRAVENOUS
  Filled 2022-04-08: qty 2

## 2022-04-08 MED ORDER — LIDOCAINE HCL URETHRAL/MUCOSAL 2 % EX GEL
1.0000 | Freq: Once | CUTANEOUS | Status: AC
  Administered 2022-04-09: 1 via TOPICAL
  Filled 2022-04-08: qty 10

## 2022-04-08 NOTE — ED Notes (Signed)
First Nurse Note: Patient sent by [facility] for further evaluation. Patient sent for abd pain with rebound tenderness. Unable to urinate at [facility] per RN.

## 2022-04-08 NOTE — ED Triage Notes (Signed)
Using medical interpreter patient sent from [facility] for further evaluation of abdominal pain. Patient c/o mid abdominal pain onset of 10:30 this am. Having some nausea. Denies any urinary symptoms.

## 2022-04-08 NOTE — ED Provider Notes (Signed)
[facility] Provider Note    Event Date/Time   First MD Initiated Contact with Patient 04/08/22 2049     (approximate)   History   Chief Complaint Abdominal Pain   HPI Taylor Mullen is a 41 y.o. male, history of GERD, obesity, fatty liver, presents to the emergency department for evaluation of mid abdominal pain.  Patient states that it started this morning around 1030 and has progressively worsened throughout the day.  Additionally reports a few episodes of vomiting as well.  Describes pain as sharp, intermittent.  Endorses a history of heartburn that he takes omeprazole for.  Denies fever/chills, chest pain, shortness of breath, headache, myalgias, diarrhea, hematochezia, hematemesis, urinary symptoms, or dizziness/lightheadedness.  History Limitations: Spanish-speaking.        Physical Exam  Triage Vital Signs: ED Triage Vitals  Enc Vitals Group     BP 04/08/22 1834 119/81     Pulse Rate 04/08/22 1834 (!) 52     Resp 04/08/22 1834 16     Temp 04/08/22 1834 98.6 F (37 C)     Temp Source 04/08/22 1834 Oral     SpO2 04/08/22 1834 97 %     Weight 04/08/22 1835 184 lb (83.5 kg)     Height 04/08/22 1835 5\' 5"  (1.651 m)     Head Circumference --      Peak Flow --      Pain Score 04/08/22 1834 9     Pain Loc --      Pain Edu? --      Excl. in GC? --     Most recent vital signs: Vitals:   04/08/22 1834  BP: 119/81  Pulse: (!) 52  Resp: 16  Temp: 98.6 F (37 C)  SpO2: 97%    General: Awake, NAD.  Skin: Warm, dry. No rashes or lesions.  Eyes: PERRL. Conjunctivae normal.  CV: Good peripheral perfusion.  Resp: Normal effort.  Abd: Soft, no distention. Neuro: At baseline. No gross neurological deficits.   Focused Exam: Mild tenderness with palpation in the epigastric and umbilical region.  Physical Exam    ED Results / Procedures / Treatments  Labs (all labs ordered are listed, but only abnormal results are displayed) Labs  Reviewed  COMPREHENSIVE METABOLIC PANEL - Abnormal; Notable for the following components:      Result Value   Glucose, Bld 134 (*)    ALT 47 (*)    All other components within normal limits  URINALYSIS, ROUTINE W REFLEX MICROSCOPIC - Abnormal; Notable for the following components:   Color, Urine YELLOW (*)    APPearance CLEAR (*)    pH 9.0 (*)    Ketones, ur 5 (*)    Protein, ur 100 (*)    All other components within normal limits  LIPASE, BLOOD  CBC     EKG Sinus rhythm, rate 54, no significant ST segment changes, no AV block, no axis deviations, normal QRS interval.   RADIOLOGY  ED Provider Interpretation: I personally viewed and interpreted this CT image, dilated fluid-filled small bowel with possible wall thickening, consistent with small bowel obstruction.  CT Abdomen Pelvis W Contrast  Result Date: 04/08/2022 CLINICAL DATA:  Acute nonlocalized abdominal pain. Rebound tenderness. Unable to urinate. EXAM: CT ABDOMEN AND PELVIS WITH CONTRAST TECHNIQUE: Multidetector CT imaging of the abdomen and pelvis was performed using the standard protocol following bolus administration of intravenous contrast. RADIATION DOSE REDUCTION: This exam was performed according to the departmental dose-optimization  program which includes automated exposure control, adjustment of the mA and/or kV according to patient size and/or use of iterative reconstruction technique. CONTRAST:  162mL OMNIPAQUE IOHEXOL 300 MG/ML  SOLN COMPARISON:  Ultrasound 01/05/2019 FINDINGS: Lower chest: Lung bases are clear. Hepatobiliary: Mild diffuse fatty infiltration of the liver. No focal lesions. Gallbladder and bile ducts are unremarkable. Pancreas: Unremarkable. No pancreatic ductal dilatation or surrounding inflammatory changes. Spleen: Normal in size without focal abnormality. Adrenals/Urinary Tract: Adrenal glands are unremarkable. Kidneys are normal, without renal calculi, focal lesion, or hydronephrosis. Bladder is  unremarkable. Stomach/Bowel: Mid abdominal small bowel loops are dilated with decompression of the distal small bowel. Transition zone appears to be in the anterior mid abdomen to the left of midline. Possible mild small bowel wall thickening. Scattered stool in the colon without distention. Appendix is normal. Vascular/Lymphatic: No significant vascular findings are present. No enlarged abdominal or pelvic lymph nodes. Reproductive: Prostate gland is mildly enlarged. Other: No free air or free fluid in the abdomen. Small periumbilical hernia and small left inguinal hernia containing fat. Musculoskeletal: No acute or significant osseous findings. IMPRESSION: Dilated fluid-filled small bowel with possible wall thickening and decompressed distal small bowel. Changes are consistent with small-bowel obstruction, possibly due to enteritis. Electronically Signed   By: Lucienne Capers M.D.   On: 04/08/2022 21:54    PROCEDURES:  Critical Care performed: N/A.  Procedures    MEDICATIONS ORDERED IN ED: Medications  ondansetron (ZOFRAN) injection 4 mg (has no administration in time range)  HYDROmorphone (DILAUDID) injection 1 mg (1 mg Intravenous Given 04/08/22 2121)  sodium chloride 0.9 % bolus 1,000 mL (1,000 mLs Intravenous New Bag/Given 04/08/22 2123)  iohexol (OMNIPAQUE) 300 MG/ML solution 100 mL (100 mLs Intravenous Contrast Given 04/08/22 2143)     IMPRESSION / MDM / ASSESSMENT AND PLAN / ED COURSE  I reviewed the triage vital signs and the nursing notes.                              Differential diagnosis includes, but is not limited to, appendicitis, GERD, gastric/peptic ulcers, pancreatitis, cholelithiasis, cholecystitis, colitis, gastroenteritis  ED Course Patient appears well, vitals within normal limits.  NAD.  We will go ahead initiate IV fluids and hydromorphone 1 mg IV.  Patient states that he has not needed any nausea medicine at this time  CBC shows no leukocytosis or anemia.  CMP  shows no electrolyte abnormalities, transaminitis, or AKI.  Urinalysis shows proteinuria, otherwise no evidence of infection.   Assessment/Plan Patient presents with sudden onset abdominal pain associated with 4 episodes of emesis that began earlier today at approximately 1030.  Patient states that his nausea has improved, but his pain persists.  CT shows evidence of small bowel obstruction, possibly due to enteritis.  Spoke with on-call surgeon Dr. Christian Mate, who recommended NG tube placement and advised that he would admit the patient to observation and see him in the morning.  Lab work-up overall unimpressive, no suspicion for infectious etiology given his presentation.  No antibiotics indicated at this time. Dr. Christian Mate agreed.   He is currently stable at this time.  We will treat here with IV fluids, NG tube, analgesics, and antiemetics.   Patient's presentation is most consistent with acute presentation with potential threat to life or bodily function.      FINAL CLINICAL IMPRESSION(S) / ED DIAGNOSES   Final diagnoses:  Small bowel obstruction ([place])     Rx /  DC Orders   ED Discharge Orders     None        Note:  This document was prepared using Dragon voice recognition software and may include unintentional dictation errors.   Varney Daily, [place] 04/08/22 2347    Gilles Chiquito, MD 04/08/22 [phone]

## 2022-04-09 ENCOUNTER — Observation Stay: Payer: BC Managed Care – PPO

## 2022-04-09 ENCOUNTER — Emergency Department: Payer: BC Managed Care – PPO

## 2022-04-09 DIAGNOSIS — K56609 Unspecified intestinal obstruction, unspecified as to partial versus complete obstruction: Secondary | ICD-10-CM | POA: Diagnosis not present

## 2022-04-09 LAB — BASIC METABOLIC PANEL
Anion gap: 4 — ABNORMAL LOW (ref 5–15)
BUN: 17 mg/dL (ref 6–20)
CO2: 26 mmol/L (ref 22–32)
Calcium: 8.4 mg/dL — ABNORMAL LOW (ref 8.9–10.3)
Chloride: 108 mmol/L (ref 98–111)
Creatinine, Ser: 0.63 mg/dL (ref 0.61–1.24)
GFR, Estimated: >60 mL/min (ref >60)
Glucose, Bld: 137 mg/dL — ABNORMAL HIGH (ref 70–99)
Potassium: 3.9 mmol/L (ref 3.5–5.1)
Sodium: 138 mmol/L (ref 135–145)

## 2022-04-09 LAB — HIV ANTIBODY (ROUTINE TESTING W REFLEX): HIV Screen 4th Generation wRfx: NONREACTIVE

## 2022-04-09 MED ORDER — ONDANSETRON HCL 4 MG/2ML IJ SOLN: 4.0000 mg | Freq: Four times a day (QID) | INTRAMUSCULAR | Status: DC | PRN

## 2022-04-09 MED ORDER — HYDROMORPHONE HCL 1 MG/ML IJ SOLN: 0.5000 mg | INTRAMUSCULAR | Status: DC | PRN

## 2022-04-09 MED ORDER — HYDRALAZINE HCL 20 MG/ML IJ SOLN: 10.0000 mg | INTRAMUSCULAR | Status: DC | PRN

## 2022-04-09 MED ORDER — HEPARIN SODIUM (PORCINE) 5000 UNIT/ML IJ SOLN
5000.0000 [IU] | Freq: Three times a day (TID) | INTRAMUSCULAR | Status: DC
Start: 2022-04-09 — End: 2022-04-10
  Administered 2022-04-09 – 2022-04-10 (×4): 5000 [IU] via SUBCUTANEOUS
  Filled 2022-04-09 (×4): qty 1

## 2022-04-09 MED ORDER — SODIUM CHLORIDE 0.9 % IV SOLN: INTRAVENOUS | Status: DC

## 2022-04-09 MED ORDER — PANTOPRAZOLE SODIUM 40 MG IV SOLR
40.0000 mg | Freq: Every day | INTRAVENOUS | Status: DC
Start: 2022-04-09 — End: 2022-04-10
  Administered 2022-04-09 (×2): 40 mg via INTRAVENOUS
  Filled 2022-04-09 (×2): qty 10

## 2022-04-09 MED ORDER — ONDANSETRON 4 MG PO TBDP: 4.0000 mg | ORAL_TABLET | Freq: Four times a day (QID) | ORAL | Status: DC | PRN

## 2022-04-09 NOTE — Plan of Care (Signed)
  Problem: Safety: Goal: Ability to remain free from injury will improve Outcome: Progressing   Problem: Coping: Goal: Level of anxiety will decrease Outcome: Progressing   Problem: Pain Managment: Goal: General experience of comfort will improve Outcome: Progressing   

## 2022-04-09 NOTE — TOC Progression Note (Signed)
Transition of Care [facility]) - Progression Note    Patient Details  Name: Taylor Mullen MRN: [id number] Date of Birth: 29-May-1981  Transition of Care [facility]) CM/SW Contact  Marlowe Sax, RN Phone Number: 04/09/2022, 12:08 PM  Clinical Narrative:      Transition of Care [facility]) Screening Note   Patient Details  Name: Taylor Mullen Date of Birth: Jan 04, 1981   Transition of Care [facility]) CM/SW Contact:    Marlowe Sax, RN Phone Number: 04/09/2022, 12:08 PM    Transition of Care Department [facility]) has reviewed patient and no TOC needs have been identified at this time. We will continue to monitor patient advancement through interdisciplinary progression rounds. If new patient transition needs arise, please place a TOC consult.         Expected Discharge Plan and Services                                                 Social Determinants of Health (SDOH) Interventions    Readmission Risk Interventions     No data to display

## 2022-04-09 NOTE — H&P (Signed)
[facility] SURGICAL HISTORY & PHYSICAL (cpt [phone])  HISTORY OF PRESENT ILLNESS (HPI):  41 y.o. male presented to [facility] ED yesterday for abdominal pain. Patient reports the acute onset of central abdominal pain yesterday around 1030. He reports this was crampy in nature. He endorsed associated nausea. No fever, chills, CP, SOB, emesis, or diarrhea. He denied any unusual food exposures. No previous intra-abdominal surgeries. Laboratory work up in the ED was reassuring. He did undergo CT Abdomen/Pelvis which was concerning for possible SBO vs enteritis. He did have NGT placed; Output recorded at 100 ccs.   General surgery is consulted by emergency medicine provider Charlsie Quest, PA-C for evaluation and management or SBO  Since admission, he reports that he is feeling much better. His pain has completely resolved. No nausea or emesis. He is passing flatus. NGT output is clear fluid.   PAST MEDICAL HISTORY (PMH):  Past Medical History:  Diagnosis Date   GERD (gastroesophageal reflux disease)     Reviewed. Otherwise negative.   PAST SURGICAL HISTORY (PSH):  Past Surgical History:  Procedure Laterality Date   ESOPHAGOGASTRODUODENOSCOPY (EGD) WITH PROPOFOL N/A 08/01/2018   Procedure: ESOPHAGOGASTRODUODENOSCOPY (EGD) WITH PROPOFOL;  Surgeon: Toney Reil, MD;  Location: [facility] ENDOSCOPY;  Service: Gastroenterology;  Laterality: N/A;   WISDOM TOOTH EXTRACTION  2018    Reviewed. Otherwise negative.   MEDICATIONS:  Prior to Admission medications   Medication Sig Start Date End Date Taking? Authorizing Provider  HYDROcodone-acetaminophen (NORCO) 7.5-325 MG tablet TAKE 1 TABLET BY MOUTH EVERY 4 TO 6 HOURS AS NEEDED FOR PAIN 03/22/19   [provider]  omeprazole (PRILOSEC) 40 MG capsule Take 1 capsule (40 mg total) by mouth 2 (two) times daily before a meal. 03/08/19 06/06/19  Vanga, Loel Dubonnet, MD     ALLERGIES:  No Known Allergies   SOCIAL HISTORY:  Social  History   Socioeconomic History   Marital status: Married    Spouse name: Not on file   Number of children: Not on file   Years of education: Not on file   Highest education level: Not on file  Occupational History   Not on file  Tobacco Use   Smoking status: Never   Smokeless tobacco: Never  Substance and Sexual Activity   Alcohol use: Never   Drug use: Never   Sexual activity: Yes    Partners: Female  Other Topics Concern   Not on file  Social History Narrative   Not on file   Social Determinants of Health   Financial Resource Strain: Not on file  Food Insecurity: Not on file  Transportation Needs: Not on file  Physical Activity: Not on file  Stress: Not on file  Social Connections: Not on file  Intimate Partner Violence: Not on file     FAMILY HISTORY:  History reviewed. No pertinent family history.  Otherwise negative.   REVIEW OF SYSTEMS:  Review of Systems  Constitutional:  Negative for chills and fever.  HENT:  Negative for congestion and sore throat.   Respiratory:  Negative for cough and shortness of breath.   Cardiovascular:  Negative for chest pain and palpitations.  Gastrointestinal:  Positive for abdominal pain and nausea. Negative for constipation, diarrhea and vomiting.  Genitourinary:  Negative for dysuria and urgency.  All other systems reviewed and are negative.   VITAL SIGNS:  Temp:  [97.7 F (36.5 C)-98.6 F (37 C)] 98.4 F (36.9 C) (07/21 0317) Pulse Rate:  [52-58] 58 (07/21 0317) Resp:  [15-16]  15 (07/21 0317) BP: (116-120)/(65-81) 116/65 (07/21 0317) SpO2:  [94 %-98 %] 98 % (07/21 0317) Weight:  [83.5 kg] 83.5 kg (07/20 1835)     Height: 5\' 5"  (165.1 cm) Weight: 83.5 kg BMI (Calculated): 30.62   PHYSICAL EXAM:  Physical Exam Vitals and nursing note reviewed.  Constitutional:      General: He is not in acute distress.    Appearance: He is well-developed. He is not ill-appearing.  HENT:     Head: Normocephalic and atraumatic.      Comments: NGT in place; output clear  Cardiovascular:     Rate and Rhythm: Normal rate and regular rhythm.     Heart sounds: Normal heart sounds. No murmur heard. Pulmonary:     Effort: Pulmonary effort is normal. No respiratory distress.     Breath sounds: Normal breath sounds.  Abdominal:     General: Abdomen is flat. There is no distension.     Palpations: Abdomen is soft.     Tenderness: There is no abdominal tenderness. There is no guarding or rebound.     Comments: Abdomen is soft, non-tender, non-distended, no rebound/guarding.   Genitourinary:    Comments: Deferred Skin:    General: Skin is warm and dry.     Coloration: Skin is not jaundiced or pale.  Neurological:     General: No focal deficit present.     Mental Status: He is alert and oriented to person, place, and time.     INTAKE/OUTPUT:  This shift: No intake/output data recorded.  Last 2 shifts: @IOLAST2SHIFTS @  Labs:     Latest Ref Rng & Units 04/08/2022    6:35 PM 12/05/2018   10:03 AM  CBC  WBC 4.0 - 10.5 K/uL 9.4  5.7   Hemoglobin 13.0 - 17.0 g/dL 04/10/2022  12/07/2018   Hematocrit 39.0 - 52.0 % 44.2  42.9   Platelets 150 - 400 K/uL 253  237       Latest Ref Rng & Units 04/09/2022    5:03 AM 04/08/2022    6:35 PM 03/08/2019    4:07 PM  CMP  Glucose 70 - 99 mg/dL 04/10/2022  03/10/2019    BUN 6 - 20 mg/dL 17  16    Creatinine 638 - 1.24 mg/dL 466  5.99    Sodium 3.57 - 145 mmol/L 138  138    Potassium 3.5 - 5.1 mmol/L 3.9  4.1    Chloride 98 - 111 mmol/L 108  101    CO2 22 - 32 mmol/L 26  29    Calcium 8.9 - 10.3 mg/dL 8.4  9.4    Total Protein 6.5 - 8.1 g/dL  7.9  7.3   Total Bilirubin 0.3 - 1.2 mg/dL  1.0  0.2   Alkaline Phos 38 - 126 U/L  79  98   AST 15 - 41 U/L  22  29   ALT 0 - 44 U/L  47  64     Imaging studies:   CT Abdomen/Pelvis (04/08/2022) personally reviewed showing dilated stomach with loops of dilated small bowel without abrupt transition, and radiologist report reviewed below:  IMPRESSION: Dilated  fluid-filled small bowel with possible wall thickening and decompressed distal small bowel. Changes are consistent with small-bowel obstruction, possibly due to enteritis.  KUB (04/09/2022) personally reviewed with air throughout colon, no significant small bowel dilation, and radiologist report reviewed below;  IMPRESSION: 1. Paucity of small bowel gas without dilated small bowel loops identified by x-ray.  Assessment/Plan: (ICD-10's: K41.609) 41 y.o. male with now resolved abdominal pain and nausea thought to potentially have small bowel obstruction; however, I suspect this is most likely rather gastritis vs gastroenteritis given lack of previous surgeries and abrupt transition on imaging.    - He is now pain free with return of bowel function, NGT output is low, and KUB this morning shows air throughout the colon. I removed NGT at bedside.  - Initiate CLD; advance as tolerated   - No surgical needs   - Monitor abdominal examination; on-going bowel function - Pian control prn; antiemetic prn - Mobilize as tolerated   - Discharge Planning: Pending advancement of diet. If he does well, he can likely DC home this evening vs tomorrow morning.   With help of spanish-english interpretor, all of the above findings and recommendations were discussed with the patient and his family, and all of their questions were answered to their expressed satisfaction.  -- Taylor Simon, PA-C [facility] 04/09/2022, 7:22 AM M-F: 7am - 4pm

## 2022-04-09 NOTE — Plan of Care (Signed)
Patient arrived to room 155 in stable condition. Aox4. Reports 2/10 abdominal pain. Denies nausea. NGT to LIS. Plan of care reviewed with patient. Call bell usage explained.    Problem: Education: Goal: Knowledge of General Education information will improve Description: Including pain rating scale, medication(s)/side effects and non-pharmacologic comfort measures 04/09/2022 0511 by Tyrah Broers, Laurena Slimmer, RN Outcome: Progressing 04/09/2022 0511 by Jag Lenz, Laurena Slimmer, RN Outcome: Progressing   Problem: Health Behavior/Discharge Planning: Goal: Ability to manage health-related needs will improve 04/09/2022 0511 by Mykale Gandolfo, Laurena Slimmer, RN Outcome: Progressing 04/09/2022 0511 by Siriyah Ambrosius, Laurena Slimmer, RN Outcome: Progressing   Problem: Clinical Measurements: Goal: Ability to maintain clinical measurements within normal limits will improve 04/09/2022 0511 by Jock Mahon, Laurena Slimmer, RN Outcome: Progressing 04/09/2022 0511 by Kayla Weekes, Laurena Slimmer, RN Outcome: Progressing Goal: Will remain free from infection 04/09/2022 0511 by Shaily Librizzi, Laurena Slimmer, RN Outcome: Progressing 04/09/2022 0511 by Warnell Rasnic, Laurena Slimmer, RN Outcome: Progressing Goal: Diagnostic test results will improve 04/09/2022 0511 by Balbina Depace, Laurena Slimmer, RN Outcome: Progressing 04/09/2022 0511 by Aydin Cavalieri, Laurena Slimmer, RN Outcome: Progressing Goal: Respiratory complications will improve 04/09/2022 0511 by Doss Cybulski, Laurena Slimmer, RN Outcome: Progressing 04/09/2022 0511 by Dinna Severs, Laurena Slimmer, RN Outcome: Progressing Goal: Cardiovascular complication will be avoided 04/09/2022 0511 by Genice Kimberlin, Laurena Slimmer, RN Outcome: Progressing 04/09/2022 0511 by Denario Bagot, Laurena Slimmer, RN Outcome: Progressing   Problem: Activity: Goal: Risk for activity intolerance will decrease 04/09/2022 0511 by Jackee Glasner, Laurena Slimmer, RN Outcome: Progressing 04/09/2022 0511 by Ashiyah Pavlak, Laurena Slimmer, RN Outcome: Progressing   Problem: Nutrition: Goal: Adequate nutrition will be  maintained 04/09/2022 0511 by Dierre Crevier, Laurena Slimmer, RN Outcome: Progressing 04/09/2022 0511 by Rekita Miotke, Laurena Slimmer, RN Outcome: Progressing   Problem: Coping: Goal: Level of anxiety will decrease 04/09/2022 0511 by Isabellamarie Randa, Laurena Slimmer, RN Outcome: Progressing 04/09/2022 0511 by Zurisadai Helminiak, Laurena Slimmer, RN Outcome: Progressing   Problem: Elimination: Goal: Will not experience complications related to bowel motility 04/09/2022 0511 by Candace Ramus, Laurena Slimmer, RN Outcome: Progressing 04/09/2022 0511 by Lugene Beougher, Laurena Slimmer, RN Outcome: Progressing Goal: Will not experience complications related to urinary retention 04/09/2022 0511 by Doctor Sheahan, Laurena Slimmer, RN Outcome: Progressing 04/09/2022 0511 by Eugenie Harewood, Laurena Slimmer, RN Outcome: Progressing   Problem: Pain Managment: Goal: General experience of comfort will improve 04/09/2022 0511 by May Ozment, Laurena Slimmer, RN Outcome: Progressing 04/09/2022 0511 by Emanuelle Hammerstrom, Laurena Slimmer, RN Outcome: Progressing   Problem: Safety: Goal: Ability to remain free from injury will improve 04/09/2022 0511 by Finneas Mathe, Laurena Slimmer, RN Outcome: Progressing 04/09/2022 0511 by Estera Ozier, Laurena Slimmer, RN Outcome: Progressing   Problem: Skin Integrity: Goal: Risk for impaired skin integrity will decrease 04/09/2022 0511 by Voncille Simm, Laurena Slimmer, RN Outcome: Progressing 04/09/2022 0511 by Scarlett Portlock, Laurena Slimmer, RN Outcome: Progressing   Problem: Nutrition: Goal: Adequate nutrition will be maintained 04/09/2022 0511 by Tyner Codner, Laurena Slimmer, RN Outcome: Progressing 04/09/2022 0511 by Tyler Cubit, Laurena Slimmer, RN Outcome: Progressing   Problem: Coping: Goal: Level of anxiety will decrease 04/09/2022 0511 by Trinette Vera, Laurena Slimmer, RN Outcome: Progressing 04/09/2022 0511 by Lorilei Horan, Laurena Slimmer, RN Outcome: Progressing   Problem: Pain Managment: Goal: General experience of comfort will improve 04/09/2022 0511 by Collie Wernick, Laurena Slimmer, RN Outcome: Progressing 04/09/2022 0511 by Marquist Binstock, Laurena Slimmer, RN Outcome:  Progressing   Problem: Safety: Goal: Ability to remain free from injury will improve 04/09/2022 0511 by Sparrow [place]ac[place], Laurena Slimmer, RN Outcome: Progressing 04/09/2022 0511 by Hawken Bielby, Laurena Slimmer, RN Outcome: Progressing   Problem: Skin Integrity: Goal: Risk for impaired skin integrity will decrease 04/09/2022 0511  by Jayro Mcmath, Laurena Slimmer, RN Outcome: Progressing 04/09/2022 0511 by Manessa Buley, Laurena Slimmer, RN Outcome: Progressing

## 2022-04-10 ENCOUNTER — Observation Stay: Payer: BC Managed Care – PPO

## 2022-04-10 DIAGNOSIS — K56609 Unspecified intestinal obstruction, unspecified as to partial versus complete obstruction: Secondary | ICD-10-CM | POA: Diagnosis not present

## 2022-04-10 NOTE — Plan of Care (Signed)
  Problem: Activity: Goal: Risk for activity intolerance will decrease Outcome: Progressing   Problem: Nutrition: Goal: Adequate nutrition will be maintained Outcome: Progressing   Problem: Coping: Goal: Level of anxiety will decrease Outcome: Progressing   

## 2022-04-11 NOTE — Discharge Summary (Addendum)
  Patient ID: Taylor Mullen MRN: [id number] DOB/AGE: 1980-11-24 41 y.o.  Admit date: 04/08/2022 Discharge date: 04/11/2022   Discharge Diagnoses:  Principal Problem:   SBO (small bowel obstruction) [facility])     Hospital Course:  41 year old male admitted with nausea vomiting consistent with ileus versus small bowel obstruction.  We observe him and he did good.  Next day the x-ray was normal.  Patient was kept for two nights.  At The time of discharge the patient was ambulating,  pain was controlled.  His vital signs were stable and she was afebrile.   physical exam at discharge showed a pt  in no acute distress.  Awake and alert.  Abdomen: Soft , NT, no peritonitis.  Extremities well-perfused and no edema.  Condition of the patient the time of discharge was stable DC time spent less 30 min    Disposition: Discharge disposition: 01-Home or Self Care       Discharge Instructions     Call MD for:  difficulty breathing, headache or visual disturbances   Complete by: As directed    Call MD for:  extreme fatigue   Complete by: As directed    Call MD for:  hives   Complete by: As directed    Call MD for:  persistant dizziness or light-headedness   Complete by: As directed    Call MD for:  persistant nausea and vomiting   Complete by: As directed    Call MD for:  redness, tenderness, or signs of infection (pain, swelling, redness, odor or green/yellow discharge around incision site)   Complete by: As directed    Call MD for:  severe uncontrolled pain   Complete by: As directed    Call MD for:  temperature >100.4   Complete by: As directed    Diet - low sodium heart healthy   Complete by: As directed    Increase activity slowly   Complete by: As directed       Allergies as of 04/10/2022   No Known Allergies      Medication List     STOP taking these medications    HYDROcodone-acetaminophen 7.5-325 MG tablet Commonly known as: NORCO   omeprazole 20 MG  capsule Commonly known as: PRILOSEC   omeprazole 40 MG capsule Commonly known as: PriLOSEC          Sterling Big, MD FACS
